# Patient Record
Sex: Female | Born: 1960
Health system: Southern US, Community
[De-identification: ages and names within clinical notes are randomized; demographics above are authoritative.]

## PROBLEM LIST (undated history)

## (undated) DIAGNOSIS — F419 Anxiety disorder, unspecified: Secondary | ICD-10-CM

## (undated) DIAGNOSIS — F32A Depression, unspecified: Secondary | ICD-10-CM

## (undated) DIAGNOSIS — I10 Essential (primary) hypertension: Secondary | ICD-10-CM

## (undated) DIAGNOSIS — S82899A Other fracture of unspecified lower leg, initial encounter for closed fracture: Secondary | ICD-10-CM

## (undated) DIAGNOSIS — F329 Major depressive disorder, single episode, unspecified: Secondary | ICD-10-CM

---

## 1998-02-18 ENCOUNTER — Encounter: Admission: RE | Admit: 1998-02-18 | Discharge: 1998-03-10 | Payer: Self-pay | Admitting: Orthopedic Surgery

## 1998-04-06 ENCOUNTER — Encounter: Admission: RE | Admit: 1998-04-06 | Discharge: 1998-04-23 | Payer: Self-pay | Admitting: Orthopedic Surgery

## 1999-03-01 HISTORY — PX: ABDOMINAL HYSTERECTOMY: SHX81

## 2001-04-20 ENCOUNTER — Encounter: Payer: Self-pay | Admitting: *Deleted

## 2001-04-20 ENCOUNTER — Ambulatory Visit (HOSPITAL_COMMUNITY): Admission: RE | Admit: 2001-04-20 | Discharge: 2001-04-20 | Payer: Self-pay | Admitting: *Deleted

## 2001-05-31 ENCOUNTER — Encounter: Payer: Self-pay | Admitting: Neurosurgery

## 2001-05-31 ENCOUNTER — Encounter: Admission: RE | Admit: 2001-05-31 | Discharge: 2001-05-31 | Payer: Self-pay | Admitting: Neurosurgery

## 2001-06-13 ENCOUNTER — Encounter: Admission: RE | Admit: 2001-06-13 | Discharge: 2001-06-13 | Payer: Self-pay | Admitting: Neurosurgery

## 2001-06-13 ENCOUNTER — Encounter: Payer: Self-pay | Admitting: Neurosurgery

## 2001-06-29 ENCOUNTER — Encounter: Payer: Self-pay | Admitting: Neurosurgery

## 2001-06-29 ENCOUNTER — Encounter: Admission: RE | Admit: 2001-06-29 | Discharge: 2001-06-29 | Payer: Self-pay | Admitting: Neurosurgery

## 2004-02-29 HISTORY — PX: OOPHORECTOMY: SHX86

## 2004-03-09 ENCOUNTER — Ambulatory Visit: Payer: Self-pay | Admitting: Internal Medicine

## 2004-03-12 ENCOUNTER — Ambulatory Visit: Payer: Self-pay | Admitting: Internal Medicine

## 2007-09-18 ENCOUNTER — Ambulatory Visit (HOSPITAL_COMMUNITY): Admission: RE | Admit: 2007-09-18 | Discharge: 2007-09-18 | Payer: Self-pay | Admitting: Family Medicine

## 2009-09-08 ENCOUNTER — Encounter: Payer: Self-pay | Admitting: Family Medicine

## 2009-09-14 ENCOUNTER — Ambulatory Visit: Payer: Self-pay | Admitting: Family Medicine

## 2009-09-14 DIAGNOSIS — M25569 Pain in unspecified knee: Secondary | ICD-10-CM | POA: Insufficient documentation

## 2009-09-16 ENCOUNTER — Ambulatory Visit (HOSPITAL_COMMUNITY): Admission: RE | Admit: 2009-09-16 | Discharge: 2009-09-16 | Payer: Self-pay | Admitting: Family Medicine

## 2009-09-18 ENCOUNTER — Encounter: Payer: Self-pay | Admitting: Family Medicine

## 2009-12-21 ENCOUNTER — Emergency Department (HOSPITAL_COMMUNITY): Admission: EM | Admit: 2009-12-21 | Discharge: 2009-12-21 | Payer: Self-pay | Admitting: Family Medicine

## 2010-01-18 ENCOUNTER — Emergency Department (HOSPITAL_COMMUNITY): Admission: EM | Admit: 2010-01-18 | Discharge: 2010-01-18 | Payer: Self-pay | Admitting: Emergency Medicine

## 2010-03-21 ENCOUNTER — Encounter: Payer: Self-pay | Admitting: Family Medicine

## 2010-03-30 NOTE — Miscellaneous (Signed)
Summary: REFERRAL  Clinical Lists Changes Discussion with her re her knee pain--the MRI did not show a meniscal tear which was my primary objective--however it did incidentally identify a small appareent ganglion cyst at the origin of the lateral head of the gastro. Seen best series 2 PDFS image 15/20. Kristen King relates her mother having sarcoma in her ankle --evidently also thought to be a ganglion cyst initially. She is quite concerned. We agree to send her for f/u of this abnormality to the surgeon who saw her mother. She is aware this is not part of the Worker's Comp claim and the referral will be billed under  her insurance. Orders: Added new Referral order of Orthopedic Surgeon Referral (Ortho Surgeon) - Signed

## 2010-03-30 NOTE — Consult Note (Signed)
Summary: Consultation Report  Consultation Report   Imported By: Marily Memos 09/08/2009 11:29:21  _____________________________________________________________________  External Attachment:    Type:   Image     Comment:   External Document

## 2010-03-30 NOTE — Assessment & Plan Note (Signed)
Summary: WC-CLAIM#[id number],L KNEE INJURY,MC   Vital Signs:  Patient profile:   50 year old female Height:      61 inches Weight:      183 pounds BMI:     34.70 BP sitting:   142 / 80  Vitals Entered By: Lillia Pauls CMA (September 14, 2009 3:26 PM)  History of Present Illness: DATE of INJURY:  05/11/2009  fell onto left knee while at work. fell onto floor surface Has had continued pain since. Was seen at occupational health 08/18/2009. Still having left knee pain--especially after long periods of standing. It also swells some at those times. Moving a certain way causes a sharper pain. Steps are bothersome. No giving way or locking. PERTINENT PMH/PSH: no prior knee injury or surgery on left  Current Medications (verified): 1)  None  Allergies (verified): 1)  ! Demerol 2)  ! Morphine  Physical Exam  General:  alert, well-developed, well-nourished, and well-hydrated.   Msk:  Left knee focally tender along lateral joint line. + [place]. Ligamentously intact. No effusion. Mild tenderness over lateral patella. No fluid in prepatellar bursa FROm in extension andflexion. Calf is soft. No skin bruising or changes  around left knee. Distalklly neirovascularly intact Additional Exam:  US exam reveals no fluid in suprapatellar pouch. Normal patellar and quad tendon. Lateral meniscus is abnormal in appearance ([place] images saved)   Impression & Recommendations:  Problem # 1:  KNEE PAIN, LEFT (ICD-719.46)  Orders: MRI without Contrast (MRI w/o Contrast) [place] LIMITED ([id number]) possibly this represents meniscal tear . We discussed options and she has decided to go for MRI will get MRI

## 2010-11-26 LAB — CBC
HCT: 35.7 — ABNORMAL LOW
Hemoglobin: 12.1
MCHC: 33.9
MCV: 83.1
Platelets: 232
RBC: 4.3
RDW: 12.6
WBC: 5.1

## 2010-11-26 LAB — COMPREHENSIVE METABOLIC PANEL
ALT: 19
AST: 23
Albumin: 3.9
Alkaline Phosphatase: 64
BUN: 12
CO2: 29
Calcium: 9.3
Chloride: 105
Creatinine, Ser: 0.65
GFR calc Af Amer: >60
GFR calc non Af Amer: >60
Glucose, Bld: 96
Potassium: 4
Sodium: 141
Total Bilirubin: 0.7
Total Protein: 6.8

## 2010-11-26 LAB — LIPID PANEL
Cholesterol: 261 — ABNORMAL HIGH
HDL: 53
LDL Cholesterol: 193 — ABNORMAL HIGH
Total CHOL/HDL Ratio: 4.9
Triglycerides: 77
VLDL: 15

## 2010-11-26 LAB — VITAMIN D 25 HYDROXY (VIT D DEFICIENCY, FRACTURES): Vit D, 25-Hydroxy: 34 (ref 30–89)

## 2011-02-10 ENCOUNTER — Other Ambulatory Visit (HOSPITAL_COMMUNITY): Payer: Self-pay | Admitting: Orthopedic Surgery

## 2011-02-10 ENCOUNTER — Ambulatory Visit (HOSPITAL_COMMUNITY)
Admission: RE | Admit: 2011-02-10 | Discharge: 2011-02-10 | Disposition: A | Discharge status: Home / Self Care | Payer: 59 | Source: Ambulatory Visit | Attending: Orthopedic Surgery | Admitting: Orthopedic Surgery

## 2011-02-10 DIAGNOSIS — W19XXXA Unspecified fall, initial encounter: Secondary | ICD-10-CM | POA: Insufficient documentation

## 2011-02-10 DIAGNOSIS — R52 Pain, unspecified: Secondary | ICD-10-CM

## 2011-02-10 DIAGNOSIS — M25579 Pain in unspecified ankle and joints of unspecified foot: Secondary | ICD-10-CM | POA: Insufficient documentation

## 2011-05-08 ENCOUNTER — Encounter (HOSPITAL_COMMUNITY): Payer: Self-pay

## 2011-05-08 ENCOUNTER — Emergency Department (HOSPITAL_COMMUNITY)
Admission: EM | Admit: 2011-05-08 | Discharge: 2011-05-08 | Disposition: A | Discharge status: Home / Self Care | Payer: 59 | Source: Home / Self Care | Attending: Family Medicine | Admitting: Family Medicine

## 2011-05-08 DIAGNOSIS — J069 Acute upper respiratory infection, unspecified: Secondary | ICD-10-CM

## 2011-05-08 MED ORDER — IPRATROPIUM BROMIDE 0.06 % NA SOLN: 2.0000 | Freq: Four times a day (QID) | NASAL | Status: AC

## 2011-05-08 MED ORDER — AZITHROMYCIN 250 MG PO TABS: ORAL_TABLET | ORAL | Status: AC

## 2011-05-08 NOTE — ED Provider Notes (Signed)
History     CSN: [id number]  Arrival date & time 05/08/11  4098   First MD Initiated Contact with Patient 05/08/11 1013      Chief Complaint  Patient presents with  . Sore Throat    (Consider location/radiation/quality/duration/timing/severity/associated sxs/prior treatment) Patient is a 51 y.o. female presenting with pharyngitis. The history is provided by the patient.  Sore Throat This is a new problem. The current episode started more than 1 week ago. The problem occurs constantly. The problem has been gradually worsening.    History reviewed. No pertinent past medical history.  Past Surgical History  Procedure Date  . Abdominal hysterectomy     History reviewed. No pertinent family history.  History  Substance Use Topics  . Smoking status: Never Smoker   . Smokeless tobacco: Not on file  . Alcohol Use: Yes    OB History    Grav Para Term Preterm Abortions TAB SAB Ect Mult Living                  Review of Systems  Constitutional: Negative for fever.  HENT: Positive for congestion, sore throat, rhinorrhea and postnasal drip.   Respiratory: Positive for cough.     Allergies  Meperidine hcl and Morphine  Home Medications   Current Outpatient Rx  Name Route Sig Dispense Refill  . SERTRALINE HCL 100 MG PO TABS Oral Take 100 mg by mouth daily.    . AZITHROMYCIN 250 MG PO TABS  Take as directed on pack 6 each 0  . IPRATROPIUM BROMIDE 0.06 % NA SOLN Nasal Place 2 sprays into the nose 4 (four) times daily. 15 mL 1    BP 152/91  Pulse 84  Temp(Src) 99.4 F (37.4 C) (Oral)  Resp 18  SpO2 99%  Physical Exam  Nursing note and vitals reviewed. Constitutional: She is oriented to person, place, and time. She appears well-developed and well-nourished.  HENT:  Head: Normocephalic.  Right Ear: External ear normal.  Left Ear: External ear normal.  Nose: Nose normal.  Mouth/Throat: Oropharynx is clear and moist.  Eyes: Pupils are equal, round, and reactive  to light.  Neck: Normal range of motion. Neck supple.  Cardiovascular: Normal rate and normal heart sounds.   Pulmonary/Chest: Breath sounds normal.  Lymphadenopathy:    She has no cervical adenopathy.  Neurological: She is alert and oriented to person, place, and time.  Skin: Skin is warm and dry.    ED Course  Procedures (including critical care time)  Labs Reviewed - No data to display No results found.   1. URI (upper respiratory infection)       MDM         Linna Hoff, MD 05/08/11 1042

## 2011-05-08 NOTE — Discharge Instructions (Signed)
Drink plenty of fluids as discussed, use medicine as prescribed, and mucinex or delsym for cough. Return or see your doctor if further problems °

## 2011-05-08 NOTE — ED Notes (Signed)
Pt has sorethroat, left earache and head congestion for two weeks.

## 2012-04-16 ENCOUNTER — Ambulatory Visit (INDEPENDENT_AMBULATORY_CARE_PROVIDER_SITE_OTHER)
Admission: RE | Admit: 2012-04-16 | Discharge: 2012-04-16 | Disposition: A | Discharge status: Home / Self Care | Payer: 59 | Source: Ambulatory Visit | Attending: Emergency Medicine | Admitting: Emergency Medicine

## 2012-04-16 ENCOUNTER — Encounter (HOSPITAL_COMMUNITY): Payer: Self-pay | Admitting: *Deleted

## 2012-04-16 ENCOUNTER — Emergency Department (HOSPITAL_COMMUNITY)
Admission: EM | Admit: 2012-04-16 | Discharge: 2012-04-16 | Disposition: A | Discharge status: Home / Self Care | Payer: 59 | Source: Home / Self Care

## 2012-04-16 DIAGNOSIS — M65839 Other synovitis and tenosynovitis, unspecified forearm: Secondary | ICD-10-CM

## 2012-04-16 DIAGNOSIS — M659 Synovitis and tenosynovitis, unspecified: Secondary | ICD-10-CM

## 2012-04-16 DIAGNOSIS — M65849 Other synovitis and tenosynovitis, unspecified hand: Secondary | ICD-10-CM

## 2012-04-16 LAB — CBC WITH DIFFERENTIAL/PLATELET
Eosinophils Absolute: 0.2 10*3/uL (ref 0.0–0.7)
Eosinophils Relative: 3 % (ref 0–5)
HCT: 39 % (ref 36.0–46.0)
Hemoglobin: 13 g/dL (ref 12.0–15.0)
Lymphs Abs: 2.4 10*3/uL (ref 0.7–4.0)
MCH: 27.4 pg (ref 26.0–34.0)
MCV: 82.3 fL (ref 78.0–100.0)
Monocytes Relative: 6 % (ref 3–12)
RBC: 4.74 MIL/uL (ref 3.87–5.11)

## 2012-04-16 MED ORDER — NAPROXEN 500 MG PO TBEC: 500.0000 mg | DELAYED_RELEASE_TABLET | Freq: Two times a day (BID) | ORAL | Status: DC

## 2012-04-16 MED ORDER — HYDROCODONE-ACETAMINOPHEN 5-325 MG PO TABS
1.0000 | ORAL_TABLET | Freq: Once | ORAL | Status: AC
  Administered 2012-04-16: 1 via ORAL

## 2012-04-16 MED ORDER — CIPROFLOXACIN HCL 500 MG PO TABS: 500.0000 mg | ORAL_TABLET | Freq: Two times a day (BID) | ORAL | Status: DC

## 2012-04-16 MED ORDER — CEFTRIAXONE SODIUM 1 G IJ SOLR
1.0000 g | Freq: Once | INTRAMUSCULAR | Status: AC
  Administered 2012-04-16: 1 g via INTRAMUSCULAR

## 2012-04-16 MED ORDER — HYDROCODONE-ACETAMINOPHEN 5-325 MG PO TABS: ORAL_TABLET | ORAL | Status: DC

## 2012-04-16 MED ORDER — CEFTRIAXONE SODIUM 1 G IJ SOLR
INTRAMUSCULAR | Status: AC
  Filled 2012-04-16: qty 10

## 2012-04-16 NOTE — ED Provider Notes (Signed)
History     CSN: [id number]  Arrival date & time 04/16/12  1002   First MD Initiated Contact with Patient 04/16/12 1025      Chief Complaint  Patient presents with  . Hand Pain    (Consider location/radiation/quality/duration/timing/severity/associated sxs/prior treatment) HPI Comments: 52 year old female works at [employer] 5 days ago she felt an unusual tingling described as what it feels like it happened a busted blood vessel. This is located along the medial side of the right thumb. It had improved somewhat over the ensuing 2-3 days however yesterday evening there was a rapid swelling involving the distal aspect of the thumb and increased size of erythema in the distal phalanx and over the IP joint. She says it throbs and is uncomfortable. She also describes some limitation in range of motion including opposition. There is mild swelling of the thenar eminence. She denies constitutional symptoms.   History reviewed. No pertinent past medical history.  Past Surgical History  Procedure Laterality Date  . Abdominal hysterectomy      History reviewed. No pertinent family history.  History  Substance Use Topics  . Smoking status: Never Smoker   . Smokeless tobacco: Not on file  . Alcohol Use: Yes    OB History   Grav Para Term Preterm Abortions TAB SAB Ect Mult Living                  Review of Systems  HENT: Negative.   Cardiovascular: Negative.   Genitourinary: Negative.   Musculoskeletal:       See history of present illness  Skin: Positive for color change. Negative for rash and wound.  Neurological:       Some numbness in the involved comments above.  All other systems reviewed and are negative.    Allergies  Meperidine hcl and Morphine  Home Medications   Current Outpatient Rx  Name  Route  Sig  Dispense  Refill  . ciprofloxacin (CIPRO) 500 MG tablet   Oral   Take 1 tablet (500 mg total) by mouth 2 (two) times daily.   14 tablet    0   . ipratropium (ATROVENT) 0.06 % nasal spray   Nasal   Place 2 sprays into the nose 4 (four) times daily.   15 mL   1   . naproxen (EC-NAPROSYN) 500 MG EC tablet   Oral   Take 1 tablet (500 mg total) by mouth 2 (two) times daily with a meal.   20 tablet   0   . sertraline (ZOLOFT) 100 MG tablet   Oral   Take 100 mg by mouth daily.           BP 148/88  Pulse 72  Temp(Src) 98.6 F (37 C) (Oral)  Resp 18  SpO2 100%  Physical Exam  Nursing note and vitals reviewed. Constitutional: She is oriented to person, place, and time. She appears well-developed and well-nourished. No distress.  Neck: Normal range of motion. Neck supple.  Cardiovascular: Normal rate.   Pulmonary/Chest: Effort normal.  Musculoskeletal:  Tenderness over the right thumb distal phalanx and IP joint. Area of swelling and erythema along the medial edge of the thumb. There is decreased range of motion with flexion of the IP joint and although she can oppose her calm there is some difficulty in that there is mild swelling of the thenar eminence. No erythema or lymphangitis extends into the hand. There is no tenderness or erythema of the MCP. Distal  sensation is somewhat disturbed and that she can feel pressure but there is also numbness present. There is mild increased warmth in the thumb.  Neurological: She is alert and oriented to person, place, and time. She exhibits normal muscle tone.  Skin: No rash noted.  C. musculoskeletal above.  Psychiatric: She has a normal mood and affect.    ED Course  Procedures (including critical care time)  Labs Reviewed - No data to display No results found.   1. Tenosynovitis of finger       MDM  The differential includes cellulitis as well as a tenosynovitis. No history or signs of injury/trauma. Although they were milder symptoms 5 days ago last night there was rapid progression of erythema, swelling and pain of the right thumb. With decreased motion. I  Obtained an appointment for her at Dr. Ronie Spies office tomorrow morning at 9:20. She has to be there  30 minutes early. She also knows to call this morning to give additional demographic data to the office. She will be administered Rocephin 1 g IM prior to discharge And Cipro 500 mg twice a day, and Naprosyn EC 500 mg twice a day with food. A splint in position of function will be applied. 1230: Dr. Mina Marble had his office call me back to order WBC and uric acid, xray R thumb. Pt st since she has talked to his office and the appointment is 3 d from now will return to the [facility] in the AM for labs and x ray. She lives out of town.  The patient was called back to obtain labs: Uric acid, a CBC an x-ray of the finger. She is also given Lortab 5/325 one by mouth now and a prescription for the same with Su 1-2 every 4 hours when necessary pain #15.  1615: Results of the other lab work now available. All within normal limits. Dr. Mina Marble was called his cell phone and his answering service picked up a message was left. Approximately 20 minutes later call the office and they were able to get a hold of him. The lab work results were also given to him the final decision was to have the patient followup in his office tomorrow morning with an appointment at 9:00 and instructions to be there at 8:30. The patient was here and was made aware of these instructions and given a written appointment time and date.     Hayden Rasmussen, NP 04/16/12 1106  Hayden Rasmussen, NP 04/16/12 1438  Hayden Rasmussen, NP 04/16/12 [phone]

## 2012-04-16 NOTE — ED Provider Notes (Signed)
Medical screening examination/treatment/procedure(s) were performed by non-physician practitioner and as supervising physician I was immediately available for consultation/collaboration.  Massiah Minjares, M.D.  Thomas Mabry C Abdulahi Schor, MD 04/16/12 2132 

## 2012-04-16 NOTE — ED Notes (Signed)
Pt  Reports  painfull   Swollen  r thumb  X  5  Days    -  She  denys  Any  Injury

## 2013-07-16 ENCOUNTER — Emergency Department (HOSPITAL_COMMUNITY): Payer: PRIVATE HEALTH INSURANCE

## 2013-07-16 ENCOUNTER — Emergency Department (HOSPITAL_COMMUNITY)
Admission: EM | Admit: 2013-07-16 | Discharge: 2013-07-16 | Disposition: A | Discharge status: Home / Self Care | Payer: PRIVATE HEALTH INSURANCE | Attending: Emergency Medicine | Admitting: Emergency Medicine

## 2013-07-16 ENCOUNTER — Encounter (HOSPITAL_COMMUNITY): Payer: Self-pay | Admitting: Emergency Medicine

## 2013-07-16 DIAGNOSIS — Z79899 Other long term (current) drug therapy: Secondary | ICD-10-CM | POA: Insufficient documentation

## 2013-07-16 DIAGNOSIS — Y929 Unspecified place or not applicable: Secondary | ICD-10-CM | POA: Insufficient documentation

## 2013-07-16 DIAGNOSIS — S93401A Sprain of unspecified ligament of right ankle, initial encounter: Secondary | ICD-10-CM

## 2013-07-16 DIAGNOSIS — Z791 Long term (current) use of non-steroidal anti-inflammatories (NSAID): Secondary | ICD-10-CM | POA: Insufficient documentation

## 2013-07-16 DIAGNOSIS — X500XXA Overexertion from strenuous movement or load, initial encounter: Secondary | ICD-10-CM | POA: Insufficient documentation

## 2013-07-16 DIAGNOSIS — Y9301 Activity, walking, marching and hiking: Secondary | ICD-10-CM | POA: Insufficient documentation

## 2013-07-16 DIAGNOSIS — Z792 Long term (current) use of antibiotics: Secondary | ICD-10-CM | POA: Insufficient documentation

## 2013-07-16 DIAGNOSIS — S93409A Sprain of unspecified ligament of unspecified ankle, initial encounter: Secondary | ICD-10-CM | POA: Insufficient documentation

## 2013-07-16 DIAGNOSIS — F3289 Other specified depressive episodes: Secondary | ICD-10-CM | POA: Insufficient documentation

## 2013-07-16 DIAGNOSIS — F329 Major depressive disorder, single episode, unspecified: Secondary | ICD-10-CM | POA: Insufficient documentation

## 2013-07-16 DIAGNOSIS — Z87891 Personal history of nicotine dependence: Secondary | ICD-10-CM | POA: Insufficient documentation

## 2013-07-16 DIAGNOSIS — F411 Generalized anxiety disorder: Secondary | ICD-10-CM | POA: Insufficient documentation

## 2013-07-16 HISTORY — DX: Major depressive disorder, single episode, unspecified: F32.9

## 2013-07-16 HISTORY — DX: Anxiety disorder, unspecified: F41.9

## 2013-07-16 HISTORY — DX: Depression, unspecified: F32.A

## 2013-07-16 NOTE — Discharge Instructions (Signed)
Take Ibuprofen or tylenol as needed for pain. Bear weight on your ankle as tolerated. Refer to attached documents for more information.

## 2013-07-16 NOTE — ED Notes (Signed)
Pt returned from xray

## 2013-07-16 NOTE — ED Notes (Signed)
Onset 7:20am pt turned right foot while walking.  Has been working all morning.  At 12:30p pt sat down for lunch, pt took, shoe off and swelling increased, is now unable to put shoe back on.  Pain while sitting 3/10, now when walking 10/10.  + pedal pulse.  Bottom pad of foot feels like pins and needles.  Pain is to lateral ankle and back of foot.

## 2013-07-16 NOTE — ED Provider Notes (Signed)
CSN: [id number]     Arrival date & time 07/16/13  1333 History  This chart was scribed for non-physician practitioner, Alvina Chou, PA-C working with Virgel Manifold, MD by Frederich Balding, ED scribe. This patient was seen in room TR08C/TR08C and the patient's care was started at 2:30 PM.   Chief Complaint  Patient presents with  . Ankle Pain   The history is provided by the patient. No language interpreter was used.   HPI Comments: Kristen King is a 53 y.o. female who presents to the Emergency Department complaining of right ankle injury that occurred earlier this morning. Pt was walking, stepped wrong and rolled her ankle. She has sudden onset right ankle pain with associated swelling that has worsened throughout the day. Pt has elevated with little relief. Bearing weight worsens the pain.   Past Medical History  Diagnosis Date  . Anxiety   . Depression    Past Surgical History  Procedure Laterality Date  . Abdominal hysterectomy     No family history on file. History  Substance Use Topics  . Smoking status: Former Research scientist (life sciences)  . Smokeless tobacco: Not on file  . Alcohol Use: 2.2 oz/week    2 Cans of beer, 2 Drinks containing 0.5 oz of alcohol per week     Comment: several beers or drinks on the weekends    OB History   Grav Para Term Preterm Abortions TAB SAB Ect Mult Living                 Review of Systems  Musculoskeletal: Positive for arthralgias and joint swelling.  All other systems reviewed and are negative.  Allergies  Meperidine hcl and Morphine  Home Medications   Prior to Admission medications   Medication Sig Start Date End Date Taking? Authorizing Provider  ciprofloxacin (CIPRO) 500 MG tablet Take 1 tablet (500 mg total) by mouth 2 (two) times daily. 04/16/12   Janne Napoleon, NP  HYDROcodone-acetaminophen (NORCO/VICODIN) 5-325 MG per tablet Take 1 or 2 tablets q 4 hours prn pain 04/16/12   Janne Napoleon, NP  naproxen (EC-NAPROSYN) 500 MG EC tablet Take 1 tablet  (500 mg total) by mouth 2 (two) times daily with a meal. 04/16/12   Janne Napoleon, NP  sertraline (ZOLOFT) 100 MG tablet Take 100 mg by mouth daily.    Historical Provider, MD   BP 171/72  Pulse 78  Temp(Src) 98.7 F (37.1 C) (Oral)  Resp 18  Ht 5' (1.524 m)  Wt 190 lb (86.183 kg)  BMI 37.11 kg/m2  SpO2 98%  Physical Exam  Nursing note and vitals reviewed. Constitutional: She is oriented to person, place, and time. She appears well-developed and well-nourished. No distress.  HENT:  Head: Normocephalic and atraumatic.  Eyes: EOM are normal.  Neck: Neck supple. No tracheal deviation present.  Cardiovascular: Normal rate.   Pulmonary/Chest: Effort normal. No respiratory distress.  Musculoskeletal: Normal range of motion.  Lateral malleolar edema of left ankle with tenderness to palpation. Limited ROM due to pain. No obvious deformity. Pt is able to wiggle toes.   Neurological: She is alert and oriented to person, place, and time.  Skin: Skin is warm and dry.  Psychiatric: She has a normal mood and affect. Her behavior is normal.    ED Course  Procedures (including critical care time)  SPLINT APPLICATION Date/Time: 3:22 PM Authorized by: Alvina Chou Consent: Verbal consent obtained. Risks and benefits: risks, benefits and alternatives were discussed Consent given by: patient Splint applied  by: nurse Location details: right ankle Splint type: ASO splint Supplies used: ASO splint Post-procedure: The splinted body part was neurovascularly unchanged following the procedure. Patient tolerance: Patient tolerated the procedure well with no immediate complications.     DIAGNOSTIC STUDIES: Oxygen Saturation is 98% on RA, normal by my interpretation.    COORDINATION OF CARE: 2:31 PM-Discussed treatment plan which includes an ankle brace, crutches and ibuprofen with pt at bedside and pt agreed to plan.   Labs Review Labs Reviewed - No data to display  Imaging Review Dg  Ankle Complete Right  07/16/2013   CLINICAL DATA:  53 year old female status post right ankle injury with pain posteriorly. Initial encounter.  EXAM: RIGHT ANKLE - COMPLETE 3+ VIEW  COMPARISON:  None.  FINDINGS: Bone mineralization is within normal limits. No joint effusion identified. Calcaneus appears intact with posterior degenerative spurring. Mortise joint alignment and tailored dome preserved. No acute fracture identified.  IMPRESSION: No acute fracture or dislocation identified about the right ankle.   Electronically Signed   By: Lars Pinks M.D.   On: 07/16/2013 14:23     EKG Interpretation None      MDM   Final diagnoses:  Right ankle sprain    2:43 PM Patient's xray shows no acute fracture. Patient likely sprained her ankle. Patient will have ASO and instructions to rest, ice, and elevate. Patient denies any other injury.   I personally performed the services described in this documentation, which was scribed in my presence. The recorded information has been reviewed and is accurate.  Alvina Chou, PA-C 07/16/13 1610

## 2013-07-20 NOTE — ED Provider Notes (Signed)
Medical screening examination/treatment/procedure(s) were performed by non-physician practitioner and as supervising physician I was immediately available for consultation/collaboration.   EKG Interpretation None       Virgel Manifold, MD 07/20/13 1341

## 2013-10-17 ENCOUNTER — Ambulatory Visit (HOSPITAL_COMMUNITY): Payer: Self-pay | Admitting: Physician Assistant

## 2013-10-25 ENCOUNTER — Encounter (INDEPENDENT_AMBULATORY_CARE_PROVIDER_SITE_OTHER): Payer: Self-pay

## 2013-10-25 ENCOUNTER — Ambulatory Visit (INDEPENDENT_AMBULATORY_CARE_PROVIDER_SITE_OTHER): Payer: 59 | Admitting: Physician Assistant

## 2013-10-25 ENCOUNTER — Encounter (HOSPITAL_COMMUNITY): Payer: Self-pay | Admitting: Physician Assistant

## 2013-10-25 VITALS — BP 130/87 | HR 63 | Ht 60.0 in | Wt 189.0 lb

## 2013-10-25 DIAGNOSIS — F3289 Other specified depressive episodes: Secondary | ICD-10-CM

## 2013-10-25 DIAGNOSIS — F321 Major depressive disorder, single episode, moderate: Secondary | ICD-10-CM

## 2013-10-25 DIAGNOSIS — F329 Major depressive disorder, single episode, unspecified: Secondary | ICD-10-CM

## 2013-10-25 MED ORDER — VENLAFAXINE HCL ER 37.5 MG PO CP24: 37.5000 mg | ORAL_CAPSULE | Freq: Every day | ORAL | Status: DC

## 2013-10-25 NOTE — Progress Notes (Signed)
Psychiatric Assessment Adult  Patient Identification:  Kristen King Date of Evaluation:  10/25/2013 Chief Complaint: depression History of Chief Complaint:   Chief Complaint  Patient presents with  . 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 This started about a year ago with difficult situation at work involving her Engineer, maintenance (IT).   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 She reports a loss of trust for her Surveyor, quantity, feels that she has been compromised morally, and that she is being targeted at work.  She has followed the proper protocol for conflict resolution per her employer's policies but has not had any follow up. She states no changes have been made in departmental procedures, and that her AD has not been held accountable for her decisions or responsibilities. The patient strongly desires closure to this incident.  Review of Systems  Psychiatric/Behavioral: Positive for dysphoric mood and decreased concentration. The patient is nervous/anxious.   All other systems reviewed and are negative.  Physical Exam  Constitutional: She appears well-developed and well-nourished.  Psychiatric: Her speech is normal and behavior is normal. Judgment and thought content normal. Her mood appears anxious. Cognition and memory are normal. She exhibits a depressed mood.    Depressive Symptoms: depressed mood, anhedonia, psychomotor  retardation, fatigue, difficulty concentrating, hopelessness, anxiety,  (Hypo) Manic Symptoms:   Elevated Mood:  No Irritable Mood:  Yes Grandiosity:  No Distractibility:  No Labiality of Mood:  No Delusions:  No Hallucinations:  No Impulsivity:  No Sexually Inappropriate Behavior:  No Financial Extravagance:  No Flight of Ideas:  No  Anxiety Symptoms: Excessive Worry:  Yes Panic Symptoms:  Yes Agoraphobia:  No Obsessive Compulsive: No  Symptoms: None, Specific Phobias:  No Social Anxiety:  Yes  Psychotic Symptoms:  Hallucinations: No None Delusions:  No Paranoia:  No   Ideas of Reference:  No  PTSD Symptoms: Ever had a traumatic exposure:  No Had a traumatic exposure in the last month:  No Re-experiencing: No None Hypervigilance:  No Hyperarousal: No None Avoidance: No None  Traumatic Brain Injury: No   Past Psychiatric History: Diagnosis:   Hospitalizations:   Outpatient Care:   Substance Abuse Care:   Self-Mutilation:   Suicidal Attempts:   Violent Behaviors:    Past Medical History:   Past Medical History  Diagnosis Date  . Anxiety   . Depression    History of Loss of Consciousness:  No Seizure History:  No Cardiac History:  No Allergies:   Allergies  Allergen Reactions  . Meperidine Hcl   . Morphine   . Novocain [Procaine] Swelling   Current Medications:  Current Outpatient Prescriptions  Medication Sig Dispense Refill  . sertraline (ZOLOFT) 100 MG tablet Take 100 mg by mouth daily.       No current facility-administered medications for this visit.    Previous Psychotropic Medications:  Medication Dose  Substance Abuse History in the last 12 months: Substance Age of 1st Use Last Use Amount Specific Type  Nicotine          Alcohol          Cannabis          Opiates          Cocaine          Methamphetamines          LSD          Ecstasy           Benzodiazepines          Caffeine           Inhalants          Others:                          Medical Consequences of Substance Abuse:   Legal Consequences of Substance Abuse:   Family Consequences of Substance Abuse:   Blackouts:  No DT's:  No Withdrawal Symptoms:  No None  Social History: Current Place of Residence: [place] of Birth: [place] Family Members: Mother, 3 sisters, father deceased Marital Status:  Married Children: 2  Sons: 1  Daughters: 1 Relationships: 1 Education:  Dentist Problems/Performance:  Religious Beliefs/Practices: agnostic History of Abuse: none Ship broker History:  None. Legal History:  Hobbies/Interests:   Family History:  No family history on file.  Mental Status Examination/Evaluation: Objective:  Appearance: Fairly Groomed  Eye Contact::  Good  Speech:  Normal Rate  Volume:  Normal  Mood:  depressed  Affect:  Tearful  Thought Process:  Goal Directed  Orientation:  Full (Time, Place, and Person)  Thought Content:  Negative  Suicidal Thoughts:  No  Homicidal Thoughts:  No  Judgement:  Good  Insight:  Good  Psychomotor Activity:  Normal  Akathisia:  No  Handed:  Right  AIMS (if indicated):    Assets:  Communication Skills Desire for Improvement Housing Leisure Time [place] Talents/Skills Transportation Vocational/Educational    Laboratory/X-Ray Psychological Evaluation(s)  requested    Assessment:    AXIS I Depression  AXIS II Deferred  AXIS III Past Medical History  Diagnosis Date  . Anxiety   . Depression      AXIS IV occupational problems  AXIS V 51-60 moderate symptoms   Treatment Plan/Recommendations:  Plan of Care: 1. Consult with OB regarding use of estrogen and possible d/c.                         2. Consider OPT for support.                         3. Effexor XR start 37.5 x 1 week, then 75 mg po qd.                         4. Send copies of most recent  labs.                         5. Start to decrease Zoloft with cross taper.  Laboratory:  none at this time.  Psychotherapy: as planned  Medications:  Vitamin D3 daily, plus B12 to start today  Routine PRN Medications:  No  Consultations: as needed  Safety Concerns:  None  at this time.  Other:      Nazier Neyhart, PA-C 8/28/201510:57 AM

## 2013-10-25 NOTE — Patient Instructions (Signed)
1. Call OB regarding decrease or discontinuing Estrogen due to increased emotional response. Consider Supplemental options for menopausal side effects. 2. Start medication as ordered. 3. Start Vit. D3 and B12 each day. 4. Decrease Zoloft as discussed. 5. RTO 2-3 weeks.

## 2013-11-01 ENCOUNTER — Encounter: Payer: Self-pay | Admitting: Physician Assistant

## 2013-11-08 ENCOUNTER — Ambulatory Visit (HOSPITAL_COMMUNITY): Payer: Self-pay | Admitting: Physician Assistant

## 2013-11-12 ENCOUNTER — Encounter (HOSPITAL_COMMUNITY): Payer: Self-pay | Admitting: Physician Assistant

## 2013-11-12 ENCOUNTER — Ambulatory Visit (HOSPITAL_COMMUNITY): Payer: Self-pay | Admitting: Physician Assistant

## 2013-11-12 ENCOUNTER — Ambulatory Visit (INDEPENDENT_AMBULATORY_CARE_PROVIDER_SITE_OTHER): Payer: 59 | Admitting: Physician Assistant

## 2013-11-12 VITALS — BP 131/73 | HR 70 | Ht 60.0 in | Wt 186.0 lb

## 2013-11-12 DIAGNOSIS — T887XXD Unspecified adverse effect of drug or medicament, subsequent encounter: Secondary | ICD-10-CM

## 2013-11-12 DIAGNOSIS — T50905D Adverse effect of unspecified drugs, medicaments and biological substances, subsequent encounter: Secondary | ICD-10-CM

## 2013-11-12 DIAGNOSIS — Z5689 Other problems related to employment: Secondary | ICD-10-CM

## 2013-11-12 DIAGNOSIS — I10 Essential (primary) hypertension: Secondary | ICD-10-CM

## 2013-11-12 DIAGNOSIS — Z569 Unspecified problems related to employment: Secondary | ICD-10-CM

## 2013-11-12 DIAGNOSIS — F321 Major depressive disorder, single episode, moderate: Secondary | ICD-10-CM

## 2013-11-12 DIAGNOSIS — F331 Major depressive disorder, recurrent, moderate: Secondary | ICD-10-CM

## 2013-11-12 MED ORDER — VENLAFAXINE HCL ER 75 MG PO TB24: 75.0000 mg | ORAL_TABLET | ORAL | Status: DC

## 2013-11-12 NOTE — Progress Notes (Signed)
[facility] [phone] Progress Note  Kristen King [id number] 53 y.o.  11/12/2013 8:42 AM  Chief Complaint: Depression  History of Present Illness:  Patient notes that she is feeling much better since her last visit. She has discontinued her Estrogen and feels at least 50% better. She states that she is now less emotionally labile and is able to "let go of some of the things that have been bothering her. Her husband has even commented on how much better she seems. Work is still difficult as she is constantly being asked by her co-workers to help them go "over the assistant Director's head, to her supervisor" but she is resistant and does not want to get involved fearing retribution. She continues to feel as if her every move is being watched, and at times feels that her superiors are looking for an any excuse to let her go.    She does report some return of the hot flashes but is not otherwise significantly bothered by the cessation of the estrogen. She reports no side effects from the Effexor XR and feels that it has helped her. Her sleep is good, and her appetite is also good.      Suicidal Ideation: No Plan Formed: No Patient has means to carry out plan: No  Homicidal Ideation: No Plan Formed: No Patient has means to carry out plan: No  Review of Systems: Psychiatric: Agitation: No Hallucination: No Depressed Mood: Yes Insomnia: No Hypersomnia: No Altered Concentration: No Feels Worthless: No Grandiose Ideas: No Belief In Special Powers: No New/Increased Substance Abuse: No Compulsions: No  Neurologic: Headache: No Seizure: No Paresthesias: No  Past Medical Family, Social History: No changes  Outpatient Encounter Prescriptions as of 11/12/2013  Medication Sig  . venlafaxine XR (EFFEXOR XR) 37.5 MG 24 hr capsule Take 1 capsule (37.5 mg total) by mouth daily.    Past Psychiatric History/Hospitalization(s): Anxiety:  Bipolar Disorder:  Depression:  Mania:   Psychosis:  Schizophrenia:  Personality Disorder:  Hospitalization for psychiatric illness:  History of Electroconvulsive Shock Therapy:  Prior Suicide Attempts: NA  Physical Exam: Constitutional:  BP 131/73  Pulse 70  Ht 5' (1.524 m)  Wt 186 lb (84.369 kg)  BMI 36.33 kg/m2  General Appearance: alert, oriented, no acute distress  Musculoskeletal: Strength & Muscle Tone: within normal limits Gait & Station: normal Patient leans: N/A  Psychiatric: Speech (describe rate, volume, coherence, spontaneity, and abnormalities if any):   Thought Process (describe rate, content, abstract reasoning, and computation):   Associations: Coherent and Relevant  Thoughts: normal  Mental Status: Orientation: oriented to person, place and time/date Mood & Affect: depressed affect and anxiety Attention Span & Concentration: normal  Medical Decision Making (Choose Three):   Assessment: Axis I: Major DD single episode moderate improving             Occupational problems             Adverse reaction to medication sequela, subsequent encounter.    Axis II:   Axis III:   Axis IV:   Axis V:     Plan:  1. Continue all medication as written. 2. Continue regular rest, exercise and diet as suggested. 3. 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 4. Follow up as planned. 5. Call this office for questions or problems.  Manhattan Mccuen, PA-C 11/12/2013

## 2013-11-15 DIAGNOSIS — F331 Major depressive disorder, recurrent, moderate: Secondary | ICD-10-CM | POA: Insufficient documentation

## 2013-11-15 DIAGNOSIS — T887XXA Unspecified adverse effect of drug or medicament, initial encounter: Secondary | ICD-10-CM | POA: Insufficient documentation

## 2013-11-15 DIAGNOSIS — Z569 Unspecified problems related to employment: Secondary | ICD-10-CM | POA: Insufficient documentation

## 2013-12-24 ENCOUNTER — Encounter (HOSPITAL_COMMUNITY): Payer: Self-pay | Admitting: Physician Assistant

## 2013-12-24 ENCOUNTER — Ambulatory Visit (INDEPENDENT_AMBULATORY_CARE_PROVIDER_SITE_OTHER): Payer: 59 | Admitting: Physician Assistant

## 2013-12-24 VITALS — BP 157/94 | HR 78 | Ht 60.0 in | Wt 182.0 lb

## 2013-12-24 DIAGNOSIS — F321 Major depressive disorder, single episode, moderate: Secondary | ICD-10-CM

## 2013-12-24 DIAGNOSIS — Z569 Unspecified problems related to employment: Secondary | ICD-10-CM

## 2013-12-24 MED ORDER — VENLAFAXINE HCL ER 75 MG PO TB24: 75.0000 mg | ORAL_TABLET | ORAL | Status: DC

## 2013-12-24 NOTE — Patient Instructions (Signed)
1. Continue all medication as ordered. 2. Call this office if you have any questions or concerns. 3. Continue to get regular exercise 3-5 times a week. 4. Continue to eat a healthy nutritionally balanced diet. 5. Continue to reduce stress and anxiety through activities such as yoga, mindfulness, meditation and or prayer. 6. Keep all appointments with your out patient therapist and have notes forwarded to this office. (If you do not have one and would like to be scheduled with a therapist, please let our office assist you with this. 7. Follow up as planned 3 months. 

## 2013-12-24 NOTE — Progress Notes (Signed)
  [facility] [phone] Progress Note  Kristen King [id number] 53 y.o.  12/24/2013 11:42 AM  Chief Complaint: Depression  History of Present Illness:  Patient notes that she is much better. Things at work are the same but she is coping well. No new complaints. Meds are working well. She has lost 17 pounds, by cutting out carbs.  States she feels much better and is more active.  Suicidal Ideation: No Plan Formed: No Patient has means to carry out plan: No  Homicidal Ideation: No Plan Formed: No Patient has means to carry out plan: No  Review of Systems: Psychiatric: Agitation: No Hallucination: No Depressed Mood: Yes Insomnia: No Hypersomnia: No Altered Concentration: No Feels Worthless: No Grandiose Ideas: No Belief In Special Powers: No New/Increased Substance Abuse: No Compulsions: No  Neurologic: Headache: No Seizure: No Paresthesias: No  Past Medical Family, Social History: States that she can no longer tolerate alcohol which is fine with her.  Outpatient Encounter Prescriptions as of 12/24/2013  Medication Sig  . Venlafaxine HCl 75 MG TB24 Take 1 tablet (75 mg total) by mouth every morning.    Past Psychiatric History/Hospitalization(s): Anxiety:  3/10 Bipolar Disorder: no Depression:  2/10 Mania: no Psychosis: no  Schizophrenia:  Personality Disorder:  Hospitalization for psychiatric illness:  History of Electroconvulsive Shock Therapy:  Prior Suicide Attempts: NA  Physical Exam: Constitutional:  BP 157/94  Pulse 78  Ht 5' (1.524 m)  Wt 182 lb (82.555 kg)  BMI 35.54 kg/m2  General Appearance: alert, oriented, no acute distress  Musculoskeletal: Strength & Muscle Tone: within normal limits Gait & Station: normal Patient leans: N/A  Psychiatric: Speech (describe rate, volume, coherence, spontaneity, and abnormalities if any):   Thought Process (describe rate, content, abstract reasoning, and computation):   Associations: Coherent  and Relevant  Thoughts: normal  Mental Status: Orientation: oriented to person, place and time/date Mood & Affect: depressed affect and anxiety Attention Span & Concentration: normal  Medical Decision Making (Choose Three):   Assessment: Axis I: Major DD single episode moderate stable             Occupational problems             Adverse reaction to medication sequela, subsequent encounter.     Plan:  1. Continue all medication as written x 3 months. 2. Continue regular rest, exercise and diet as suggested. 3. 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 4. Follow up as planned. 5. Call this office for questions or problems.  Heman Que, PA-C 12/24/2013

## 2014-01-13 ENCOUNTER — Other Ambulatory Visit (HOSPITAL_COMMUNITY): Payer: Self-pay | Admitting: Physician Assistant

## 2014-03-27 ENCOUNTER — Ambulatory Visit (HOSPITAL_COMMUNITY): Payer: Self-pay | Admitting: Physician Assistant

## 2014-04-04 ENCOUNTER — Ambulatory Visit (HOSPITAL_COMMUNITY): Payer: Self-pay | Admitting: Physician Assistant

## 2014-04-24 ENCOUNTER — Ambulatory Visit (HOSPITAL_COMMUNITY): Payer: Self-pay | Admitting: Physician Assistant

## 2014-04-30 ENCOUNTER — Encounter (HOSPITAL_COMMUNITY): Payer: Self-pay | Admitting: Physician Assistant

## 2014-04-30 ENCOUNTER — Ambulatory Visit (INDEPENDENT_AMBULATORY_CARE_PROVIDER_SITE_OTHER): Payer: 59 | Admitting: Physician Assistant

## 2014-04-30 VITALS — BP 150/90 | HR 80 | Ht 60.0 in | Wt 183.0 lb

## 2014-04-30 DIAGNOSIS — F5105 Insomnia due to other mental disorder: Secondary | ICD-10-CM

## 2014-04-30 DIAGNOSIS — F4321 Adjustment disorder with depressed mood: Secondary | ICD-10-CM

## 2014-04-30 DIAGNOSIS — Z569 Unspecified problems related to employment: Secondary | ICD-10-CM

## 2014-04-30 DIAGNOSIS — G4700 Insomnia, unspecified: Secondary | ICD-10-CM

## 2014-04-30 DIAGNOSIS — F321 Major depressive disorder, single episode, moderate: Secondary | ICD-10-CM

## 2014-04-30 MED ORDER — VENLAFAXINE HCL ER 150 MG PO TB24: 150.0000 mg | ORAL_TABLET | Freq: Every day | ORAL | Status: DC

## 2014-04-30 MED ORDER — ZOLPIDEM TARTRATE 5 MG PO TABS: ORAL_TABLET | ORAL | Status: DC

## 2014-04-30 NOTE — Progress Notes (Signed)
  [place] [phone] Progress Note  Kristen King [id number] 54 y.o.  04/30/2014 1:18 PM  Chief Complaint: Depression  History of Present Illness:  Kristen King comes in today to follow up and states she really is doing OK, but notes the stress of working for [employer] has become overwhelming to her. Her Father in law recently passed away, and she has been approached to interview for a new job. She states she is not sleeping as well lately, and can't seem to convey anything without becoming overly emotional.  Suicidal Ideation: No Plan Formed: No Patient has means to carry out plan: No  Homicidal Ideation: No Plan Formed: No Patient has means to carry out plan: No  Review of Systems: Psychiatric: Agitation: No Hallucination: No Depressed Mood: Yes Insomnia: No Hypersomnia: No Altered Concentration: No Feels Worthless: No Grandiose Ideas: No Belief In Special Powers: No New/Increased Substance Abuse: No Compulsions: No  Neurologic: Headache: No Seizure: No Paresthesias: No  Past Medical Family, Social History: States that she can no longer tolerate alcohol which is fine with her.  Outpatient Encounter Prescriptions as of 04/30/2014  Medication Sig  . Venlafaxine HCl 75 MG TB24 Take 1 tablet (75 mg total) by mouth every morning.    Past Psychiatric History/Hospitalization(s): Anxiety:  5/10 Bipolar Disorder: no Depression:  2/10 Mania: no Psychosis: no  Schizophrenia:  Personality Disorder:  Hospitalization for psychiatric illness:  History of Electroconvulsive Shock Therapy:  Prior Suicide Attempts: NA  Physical Exam: Constitutional:  BP 150/90 mmHg  Pulse 80  Ht 5' (1.524 m)  Wt 183 lb (83.008 kg)  BMI 35.74 kg/m2  General Appearance: alert, oriented, no acute distress  Musculoskeletal: Strength & Muscle Tone: within normal limits Gait & Station: normal Patient leans: N/A  Psychiatric: Speech (describe rate, volume, coherence, spontaneity, and  abnormalities if any):   Thought Process (describe rate, content, abstract reasoning, and computation):   Associations: Coherent and Relevant  Thoughts: normal  Mental Status: Orientation: oriented to person, place and time/date Mood & Affect: constricted and tearful but appropriate Attention Span & Concentration: normal  Medical Decision Making (Choose Three):   Assessment: Axis I: Major DD single episode moderate stable             Occupational problems             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     Plan:  1. For the depression will increase venlafaxine to 150mg  po qd. 2. For the insomnia will use low dose of Ambien 5 mg at hs. 3. Will recommend Vitamin D3 (5000 IU) and add B complex per day. 4. Will also recommend OPT for resolution of problem. 5. Follow up 3 weeks.    Lovenia Debruler, PA-C 04/30/2014

## 2014-04-30 NOTE — Patient Instructions (Signed)
1. Continue all medication as ordered. 2. Call this office if you have any questions or concerns. 3. Continue to get regular exercise 3-5 times a week. 4. Continue to eat a healthy nutritionally balanced diet. 5. Continue to reduce stress and anxiety through activities such as yoga, mindfulness, meditation and or prayer. 6. Keep all appointments with your out patient therapist and have notes forwarded to this office. (If you do not have one and would like to be scheduled with a therapist, please let our office assist you with this. 7. Follow up as planned 3 weeks.

## 2014-05-21 ENCOUNTER — Ambulatory Visit (HOSPITAL_COMMUNITY): Payer: Self-pay | Admitting: Physician Assistant

## 2014-06-02 ENCOUNTER — Other Ambulatory Visit (HOSPITAL_COMMUNITY): Payer: Self-pay | Admitting: Physician Assistant

## 2014-06-05 ENCOUNTER — Telehealth (HOSPITAL_COMMUNITY): Payer: Self-pay | Admitting: *Deleted

## 2014-06-05 MED ORDER — VENLAFAXINE HCL ER 150 MG PO TB24: 150.0000 mg | ORAL_TABLET | Freq: Every day | ORAL | Status: DC

## 2014-06-05 NOTE — Telephone Encounter (Signed)
Pt called for a refilled for Venlafaxine HCL 150mg . Per Nena Polio, PA-C, pt is authorized for a refill for Venlafaxine HCL 150mg , Qty 30. Prescription was sent to pharmacy. Pt has f/u appt on 5/3. Called and informed pt of prescription status. Pt states and shows understanding.

## 2014-06-06 ENCOUNTER — Ambulatory Visit (INDEPENDENT_AMBULATORY_CARE_PROVIDER_SITE_OTHER): Payer: 59 | Admitting: Licensed Clinical Social Worker

## 2014-06-06 DIAGNOSIS — F3340 Major depressive disorder, recurrent, in remission, unspecified: Secondary | ICD-10-CM | POA: Diagnosis not present

## 2014-06-06 DIAGNOSIS — Z569 Unspecified problems related to employment: Secondary | ICD-10-CM | POA: Diagnosis not present

## 2014-06-06 NOTE — Progress Notes (Signed)
Patient:   Kristen King   DOB:   20-Apr-1960  MR Number:  [id number]  Location:  [facility] [address] [place] [address] Dept: [phone]           Date of Service:   06/06/14  Start Time:   2:15pm End Time:   3:00pm  Provider/Observer:  [facility]       Billing Code/Service: [phone]  Comprehensive Clinical Assessment  Information for assessment provided by: patient   Chief Complaint:  Occupational stress       Presenting Problem/Symptoms:  "I've always had trouble with anxiety and depression, but I've always been able to deal with it up until now."     "I'm just not happy at my job.  I get very emotional about it."  She is a Marine scientist.  Has been with [employer] for 20 years.  Has a very good salary and benefits, but not happy with the company. Reports having conflict with management.  Sought help from HR, but still feels management is targeting her.        Mental Health Symptoms:    Depression: denies at this time  Past episodes of depression: yes  Anxiety: excessive worry     Panic Attacks: Absent   Self-Harm Potential: Thoughts of Self-Harm: none Method: na Availability of means: na   Dangerousness to Others Potential: Denies     Mania/hypomania: denies        Psychosis:  denies    Abuse/Trauma History: denies  PTSD symptoms: na      Mental Status  Interactions:    Active   Attention:   Good  Memory:   Intact  Speech:   Normal   Flow of Thought:  Normal  Thought Content:  WNL  Orientation:   person, place and time/date  Judgment:   Good  Affect/Mood:   Appropriate  Insight:   Good        Medical History:    Past Medical History  Diagnosis Date  . Anxiety   . Depression      Current medications:  Effexor 150mg  daily  Ran out of the medication in the last week.  Reports she has been going  through withdrawal, much more emotional than usual                 Mental Health/Substance Use Treatment History:    Started medication management last year     Family Med/Psych History: Mom-depression                                                  Dad-committed suicide    Substance Use History:   Alcohol- currently a couple beers over the weekend Former smoker       Marital Status:  Married to Kristen King for 37 years.  High school sweethearts  Lives with: husband  Family Relationships:   Daughter, Kristen King (25)- a nurse lives in [place], married with 4 children, talk often Son, Kristen King (68)- lives in [place], a Environmental education officer, has a fiancee, good relationship  Mom (76)- suffers from depression, needs a lot of encouragement, lives in [place] where Kristen King was raised, very close Dad committed suicide when Candelaria was 63 years old. Mom remarried a few  years later.  The marriage didn't last.  She is the oldest of 4 sisters.  "I sort of became the mother figure"   Very close with sisters.        Other Social Supports: recently distanced herself from her best friend     Current Employment: Full time nurse with [employer]   Works three 12 hour days                                                 "I don't feel challenged anymore."  Doesn't feel like she can talk to management.  The last year has been especially difficult.  Often feels like they push her buttons.     Education:   Scientist, water quality History:  None  Military Involvement: none  Religion/Spirituality:  Grew up going to church.  Does believe in a higher power.    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  Patient indicates she is satisfied with how she is coping with life stressors.  Anticipates that a change in employment will help considerably.                                        Continue medication management with Kristen Polio, PA-C.  Restart use of Effexor ASAP.

## 2014-06-18 ENCOUNTER — Ambulatory Visit (HOSPITAL_COMMUNITY): Payer: Self-pay | Admitting: Physician Assistant

## 2014-06-20 ENCOUNTER — Ambulatory Visit (HOSPITAL_COMMUNITY): Payer: Self-pay | Admitting: Licensed Clinical Social Worker

## 2014-06-24 ENCOUNTER — Encounter (HOSPITAL_COMMUNITY): Payer: Self-pay | Admitting: Physician Assistant

## 2014-06-24 ENCOUNTER — Ambulatory Visit (INDEPENDENT_AMBULATORY_CARE_PROVIDER_SITE_OTHER): Payer: 59 | Admitting: Physician Assistant

## 2014-06-24 VITALS — BP 128/78 | HR 84 | Ht 60.0 in | Wt 189.0 lb

## 2014-06-24 DIAGNOSIS — F5105 Insomnia due to other mental disorder: Secondary | ICD-10-CM

## 2014-06-24 DIAGNOSIS — F3340 Major depressive disorder, recurrent, in remission, unspecified: Secondary | ICD-10-CM

## 2014-06-24 MED ORDER — VENLAFAXINE HCL ER 150 MG PO TB24: 150.0000 mg | ORAL_TABLET | Freq: Every day | ORAL | Status: DC

## 2014-06-24 NOTE — Patient Instructions (Signed)
1. Take all of your medications as discussed with your provider. (Please check your AVS, for the list.) 2. Call this office for any questions or problems. 3. Be sure to get plenty of rest and try for 7-9 hours of quality sleep each night. 4. Try to get regular exercise, at least 15-30 minutes each day.  A good walk will help tremendously! 5. Remember to do your mindfulness each day, breath deeply in and out, while having quiet reflection, prayer, meditation, or positive visualization. Unplug and turn off all electronic devices each day for your own personal time without interruption. This works! There are studies to back this up! 6. Be sure to take your B complex and Vitamin D3 each day. This will improve your overall wellbeing and boost your immune system as well. 7. Try to eat a nutritious healthy diet and avoid excessive alcohol and ALL tobacco products. 8. Be sure to keep all of your appointments with your outpatient therapist. If you do not have one, our office will be happy to assist you with this. 9. Be sure to keep your next follow up appointment in 3 months. 

## 2014-06-24 NOTE — Progress Notes (Signed)
  [facility] [phone] Progress Note  Kristen King [id number] 53 y.o.  06/24/2014 5:09 PM  Chief Complaint: Depression  History of Present Illness:     Suicidal Ideation: No Plan Formed: No Patient has means to carry out plan: No  Homicidal Ideation: No Plan Formed: No Patient has means to carry out plan: No  Review of Systems: Psychiatric: Agitation: No Hallucination: No Depressed Mood: Yes Insomnia: No Hypersomnia: No Altered Concentration: No Feels Worthless: No Grandiose Ideas: No Belief In Special Powers: No New/Increased Substance Abuse: No Compulsions: No  Neurologic: Headache: No Seizure: No Paresthesias: No  Past Medical Family, Social History: States that she can no longer tolerate alcohol which is fine with her.  Outpatient Encounter Prescriptions as of 04/30/2014  Medication Sig  . Venlafaxine HCl 75 MG TB24 Take 1 tablet (75 mg total) by mouth every morning.    Past Psychiatric History/Hospitalization(s): Anxiety:  5/10 Bipolar Disorder: no Depression:  2/10 Mania: no Psychosis: no  Schizophrenia:  Personality Disorder:  Hospitalization for psychiatric illness:  History of Electroconvulsive Shock Therapy:  Prior Suicide Attempts: NA  Physical Exam: Constitutional:  BP 128/78 mmHg  Pulse 84  Ht 5' (1.524 m)  Wt 189 lb (85.73 kg)  BMI 36.91 kg/m2  General Appearance: alert, oriented, no acute distress  Musculoskeletal: Strength & Muscle Tone: within normal limits Gait & Station: normal Patient leans: N/A  Psychiatric: Speech (describe rate, volume, coherence, spontaneity, and abnormalities if any):   Thought Process (describe rate, content, abstract reasoning, and computation):   Associations: Coherent and Relevant  Thoughts: normal  Mental Status: Orientation: oriented to person, place and time/date Mood & Affect: constricted and tearful but appropriate Attention Span & Concentration: normal  Medical Decision  Making (Choose Three):   Assessment: Axis I: Major DD single episode moderate stable             Occupational problems             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     Plan:  1. For the depression will increase venlafaxine to 150mg  po qd. 2. For the insomnia will use low dose of Ambien 5 mg at hs. 3. Will recommend Vitamin D3 (5000 IU) and add B complex per day. 4. Will also recommend OPT for resolution of problem. 5. Follow up 3 months.   Whalen Trompeter, PA-C 06/24/2014

## 2014-07-04 ENCOUNTER — Ambulatory Visit (HOSPITAL_COMMUNITY): Payer: Self-pay | Admitting: Licensed Clinical Social Worker

## 2014-09-12 ENCOUNTER — Ambulatory Visit (INDEPENDENT_AMBULATORY_CARE_PROVIDER_SITE_OTHER): Payer: 59 | Admitting: Physician Assistant

## 2014-09-12 ENCOUNTER — Encounter (HOSPITAL_COMMUNITY): Payer: Self-pay | Admitting: Physician Assistant

## 2014-09-12 VITALS — BP 126/70 | HR 78 | Ht 60.0 in | Wt 187.0 lb

## 2014-09-12 DIAGNOSIS — Z569 Unspecified problems related to employment: Secondary | ICD-10-CM

## 2014-09-12 DIAGNOSIS — F5105 Insomnia due to other mental disorder: Secondary | ICD-10-CM

## 2014-09-12 DIAGNOSIS — F321 Major depressive disorder, single episode, moderate: Secondary | ICD-10-CM | POA: Diagnosis not present

## 2014-09-12 DIAGNOSIS — F3340 Major depressive disorder, recurrent, in remission, unspecified: Secondary | ICD-10-CM

## 2014-09-12 MED ORDER — VENLAFAXINE HCL ER 150 MG PO TB24: 150.0000 mg | ORAL_TABLET | Freq: Every day | ORAL | Status: DC

## 2014-09-12 NOTE — Progress Notes (Signed)
[facility] [phone] Progress Note  Kristen King [id number] 54 y.o.  09/12/2014 2:31 PM  Chief Complaint: Depression  History of Present Illness:        Kristen King is in to follow up on her depression. Her symptoms are currently stable and she is doing well despite some set backs at work.        She reports no new symptoms and is coping well with the harassment she is getting at work.   Suicidal Ideation: No Plan Formed: No Patient has means to carry out plan: No  Homicidal Ideation: No Plan Formed: No Patient has means to carry out plan: No  Review of Systems: Psychiatric: Agitation: No Hallucination: No Depressed Mood: Yes Insomnia: No Hypersomnia: No Altered Concentration: No Feels Worthless: No Grandiose Ideas: No Belief In Special Powers: No New/Increased Substance Abuse: No Compulsions: No  Neurologic: Headache: No Seizure: No Paresthesias: No  Past Medical Family, Social History:  No changes   Outpatient Encounter Prescriptions as of 04/30/2014  Medication Sig  . Venlafaxine HCl 75 MG TB24 Take 1 tablet (75 mg total) by mouth every morning.     Physical Exam: Constitutional:  BP 126/70 mmHg  Pulse 78  Ht 5' (1.524 m)  Wt 187 lb (84.823 kg)  BMI 36.52 kg/m2  SpO2 98%    Total Time spent with patient: 30 minutes Musculoskeletal: Strength & Muscle Tone: within normal limits Gait & Station: normal Patient leans: N/A   Psychiatric Specialty Exam: Physical Exam  ROS  Blood pressure 126/70, pulse 78, height 5' (1.524 m), weight 187 lb (84.823 kg), SpO2 98 %.Body mass index is 36.52 kg/(m^2).  General Appearance: Neat and Well Groomed  Engineer, water::  Good  Speech:  Clear and Coherent  Volume:  Normal  Mood:  Euthymic  Affect:  Appropriate and Congruent  Thought Process:  Coherent, Goal Directed and Intact  Orientation:  Full (Time, Place, and Person)  Thought Content:  WDL  Suicidal Thoughts:  No  Homicidal Thoughts:  No  Memory:  NA   Judgement:  Intact  Insight:  Good  Psychomotor Activity:  Normal  Concentration:  Good  Recall:  Good  Fund of Knowledge:Good  Language: Good  Akathisia:  Negative  Handed:  Right  AIMS (if indicated):     Assets:  Communication Skills Desire for Improvement Financial Resources/Insurance Housing Leisure Time [place] Talents/Skills Transportation Vocational/Educational  Sleep:      Medical Decision Making (Choose Three):   Assessment: stable  Major DD single episode moderate stable  Occupational problems  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  resolved   Plan:  1. Continue Venlafaxine 150 mg as written x 3 months + 1RF. 2. As this patient is very stable and coping well with her stress, demonstrating good resolution of symptoms despite continuing adversity at work, she is able to be followed by her PCP at this point. 3. As this provider is leaving the practice this patient can be transferred back to the care of her PCP for further follow up treatment. 4. Should the need arise I am confident that this RN will be able to recognize the need to return to psychiatry and act accordingly.  Medications are written as documented. 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   Amoree Newlon, PA-C 09/12/2014

## 2014-09-23 ENCOUNTER — Ambulatory Visit (HOSPITAL_COMMUNITY): Payer: Self-pay | Admitting: Physician Assistant

## 2014-10-03 ENCOUNTER — Other Ambulatory Visit (HOSPITAL_COMMUNITY): Payer: Self-pay | Admitting: Physician Assistant

## 2015-04-06 MED FILL — VENLAFAXINE HCL ER 150 MG C: 150 | 90 days supply | Qty: 90 | Fill #0

## 2015-05-28 DIAGNOSIS — H25012 Cortical age-related cataract, left eye: Secondary | ICD-10-CM | POA: Diagnosis not present

## 2015-05-28 DIAGNOSIS — H52223 Regular astigmatism, bilateral: Secondary | ICD-10-CM | POA: Diagnosis not present

## 2015-05-28 DIAGNOSIS — H5203 Hypermetropia, bilateral: Secondary | ICD-10-CM | POA: Diagnosis not present

## 2015-05-28 DIAGNOSIS — H524 Presbyopia: Secondary | ICD-10-CM | POA: Diagnosis not present

## 2015-06-11 DIAGNOSIS — H2513 Age-related nuclear cataract, bilateral: Secondary | ICD-10-CM | POA: Diagnosis not present

## 2015-06-11 DIAGNOSIS — H43813 Vitreous degeneration, bilateral: Secondary | ICD-10-CM | POA: Diagnosis not present

## 2015-07-13 MED FILL — VENLAFAXINE HCL ER 150 MG C: 150 | 90 days supply | Qty: 90 | Fill #1

## 2015-08-17 DIAGNOSIS — I10 Essential (primary) hypertension: Secondary | ICD-10-CM | POA: Diagnosis not present

## 2015-08-17 DIAGNOSIS — E785 Hyperlipidemia, unspecified: Secondary | ICD-10-CM | POA: Diagnosis not present

## 2015-08-17 DIAGNOSIS — F411 Generalized anxiety disorder: Secondary | ICD-10-CM | POA: Diagnosis not present

## 2015-08-17 DIAGNOSIS — R8299 Other abnormal findings in urine: Secondary | ICD-10-CM | POA: Diagnosis not present

## 2015-08-17 MED FILL — LISINOPRIL 10 MG TABLET: 10 | 30 days supply | Qty: 30 | Fill #0 | Status: TO

## 2015-08-21 DIAGNOSIS — S8264XA Nondisplaced fracture of lateral malleolus of right fibula, initial encounter for closed fracture: Secondary | ICD-10-CM | POA: Diagnosis not present

## 2015-08-21 DIAGNOSIS — M79671 Pain in right foot: Secondary | ICD-10-CM | POA: Diagnosis not present

## 2015-08-21 DIAGNOSIS — S93421A Sprain of deltoid ligament of right ankle, initial encounter: Secondary | ICD-10-CM | POA: Diagnosis not present

## 2015-08-21 DIAGNOSIS — S8261XA Displaced fracture of lateral malleolus of right fibula, initial encounter for closed fracture: Secondary | ICD-10-CM | POA: Diagnosis not present

## 2015-08-21 DIAGNOSIS — M25571 Pain in right ankle and joints of right foot: Secondary | ICD-10-CM | POA: Diagnosis not present

## 2015-08-21 DIAGNOSIS — Z6835 Body mass index (BMI) 35.0-35.9, adult: Secondary | ICD-10-CM | POA: Diagnosis not present

## 2015-08-21 DIAGNOSIS — S93491A Sprain of other ligament of right ankle, initial encounter: Secondary | ICD-10-CM | POA: Diagnosis not present

## 2015-08-24 ENCOUNTER — Encounter (HOSPITAL_BASED_OUTPATIENT_CLINIC_OR_DEPARTMENT_OTHER): Payer: Self-pay | Admitting: *Deleted

## 2015-08-24 ENCOUNTER — Other Ambulatory Visit: Payer: Self-pay | Admitting: Orthopedic Surgery

## 2015-08-24 DIAGNOSIS — S8261XA Displaced fracture of lateral malleolus of right fibula, initial encounter for closed fracture: Secondary | ICD-10-CM | POA: Diagnosis not present

## 2015-08-27 ENCOUNTER — Encounter (HOSPITAL_BASED_OUTPATIENT_CLINIC_OR_DEPARTMENT_OTHER)
Admission: RE | Disposition: A | Discharge status: Home / Self Care | Payer: Self-pay | Source: Ambulatory Visit | Attending: Orthopedic Surgery

## 2015-08-27 ENCOUNTER — Encounter (HOSPITAL_BASED_OUTPATIENT_CLINIC_OR_DEPARTMENT_OTHER): Payer: Self-pay | Admitting: *Deleted

## 2015-08-27 ENCOUNTER — Ambulatory Visit (HOSPITAL_BASED_OUTPATIENT_CLINIC_OR_DEPARTMENT_OTHER): Payer: 59 | Admitting: Anesthesiology

## 2015-08-27 ENCOUNTER — Ambulatory Visit (HOSPITAL_BASED_OUTPATIENT_CLINIC_OR_DEPARTMENT_OTHER)
Admission: RE | Admit: 2015-08-27 | Discharge: 2015-08-27 | Disposition: A | Discharge status: Home / Self Care | Payer: 59 | Source: Ambulatory Visit | Attending: Orthopedic Surgery | Admitting: Orthopedic Surgery

## 2015-08-27 DIAGNOSIS — Z87891 Personal history of nicotine dependence: Secondary | ICD-10-CM | POA: Diagnosis not present

## 2015-08-27 DIAGNOSIS — Z885 Allergy status to narcotic agent status: Secondary | ICD-10-CM | POA: Diagnosis not present

## 2015-08-27 DIAGNOSIS — S8261XA Displaced fracture of lateral malleolus of right fibula, initial encounter for closed fracture: Secondary | ICD-10-CM | POA: Diagnosis not present

## 2015-08-27 DIAGNOSIS — W19XXXA Unspecified fall, initial encounter: Secondary | ICD-10-CM | POA: Diagnosis not present

## 2015-08-27 DIAGNOSIS — I10 Essential (primary) hypertension: Secondary | ICD-10-CM | POA: Insufficient documentation

## 2015-08-27 DIAGNOSIS — G8918 Other acute postprocedural pain: Secondary | ICD-10-CM | POA: Diagnosis not present

## 2015-08-27 DIAGNOSIS — S82841A Displaced bimalleolar fracture of right lower leg, initial encounter for closed fracture: Secondary | ICD-10-CM | POA: Diagnosis not present

## 2015-08-27 DIAGNOSIS — M25571 Pain in right ankle and joints of right foot: Secondary | ICD-10-CM | POA: Diagnosis not present

## 2015-08-27 HISTORY — PX: ORIF ANKLE FRACTURE: SHX5408

## 2015-08-27 HISTORY — DX: Other fracture of unspecified lower leg, initial encounter for closed fracture: S82.899A

## 2015-08-27 HISTORY — DX: Essential (primary) hypertension: I10

## 2015-08-27 SURGERY — OPEN REDUCTION INTERNAL FIXATION (ORIF) ANKLE FRACTURE
Anesthesia: General | Site: Ankle | Laterality: Right

## 2015-08-27 MED ORDER — LIDOCAINE 2% (20 MG/ML) 5 ML SYRINGE
INTRAMUSCULAR | Status: AC
  Filled 2015-08-27: qty 5

## 2015-08-27 MED ORDER — PROPOFOL 10 MG/ML IV BOLUS
INTRAVENOUS | Status: DC | PRN
  Administered 2015-08-27: 200 mg via INTRAVENOUS

## 2015-08-27 MED ORDER — DEXAMETHASONE SODIUM PHOSPHATE 10 MG/ML IJ SOLN
INTRAMUSCULAR | Status: DC | PRN
  Administered 2015-08-27: 10 mg via INTRAVENOUS

## 2015-08-27 MED ORDER — LIDOCAINE 2% (20 MG/ML) 5 ML SYRINGE
INTRAMUSCULAR | Status: DC | PRN
  Administered 2015-08-27: 80 mg via INTRAVENOUS

## 2015-08-27 MED ORDER — SODIUM CHLORIDE 0.9 % IV SOLN: INTRAVENOUS | Status: DC

## 2015-08-27 MED ORDER — ONDANSETRON HCL 4 MG/2ML IJ SOLN
INTRAMUSCULAR | Status: AC
  Filled 2015-08-27: qty 2

## 2015-08-27 MED ORDER — CEFAZOLIN SODIUM-DEXTROSE 2-4 GM/100ML-% IV SOLN
INTRAVENOUS | Status: AC
  Filled 2015-08-27: qty 100

## 2015-08-27 MED ORDER — FENTANYL CITRATE (PF) 100 MCG/2ML IJ SOLN
INTRAMUSCULAR | Status: AC
  Filled 2015-08-27: qty 2

## 2015-08-27 MED ORDER — DEXAMETHASONE SODIUM PHOSPHATE 10 MG/ML IJ SOLN
INTRAMUSCULAR | Status: AC
  Filled 2015-08-27: qty 1

## 2015-08-27 MED ORDER — PROPOFOL 10 MG/ML IV BOLUS
INTRAVENOUS | Status: AC
  Filled 2015-08-27: qty 20

## 2015-08-27 MED ORDER — BUPIVACAINE-EPINEPHRINE (PF) 0.5% -1:200000 IJ SOLN
INTRAMUSCULAR | Status: DC | PRN
  Administered 2015-08-27: 30 mL via PERINEURAL

## 2015-08-27 MED ORDER — LACTATED RINGERS IV SOLN
INTRAVENOUS | Status: DC
  Administered 2015-08-27 (×2): via INTRAVENOUS

## 2015-08-27 MED ORDER — OXYCODONE HCL 5 MG PO TABS: 5.0000 mg | ORAL_TABLET | ORAL | Status: DC | PRN

## 2015-08-27 MED ORDER — FENTANYL CITRATE (PF) 100 MCG/2ML IJ SOLN: 25.0000 ug | INTRAMUSCULAR | Status: DC | PRN

## 2015-08-27 MED ORDER — PROMETHAZINE HCL 25 MG/ML IJ SOLN: 6.2500 mg | INTRAMUSCULAR | Status: DC | PRN

## 2015-08-27 MED ORDER — FENTANYL CITRATE (PF) 100 MCG/2ML IJ SOLN
50.0000 ug | INTRAMUSCULAR | Status: DC | PRN
  Administered 2015-08-27 (×2): 50 ug via INTRAVENOUS

## 2015-08-27 MED ORDER — CHLORHEXIDINE GLUCONATE 4 % EX LIQD: 60.0000 mL | Freq: Once | CUTANEOUS | Status: DC

## 2015-08-27 MED ORDER — GLYCOPYRROLATE 0.2 MG/ML IJ SOLN: 0.2000 mg | Freq: Once | INTRAMUSCULAR | Status: DC | PRN

## 2015-08-27 MED ORDER — MIDAZOLAM HCL 2 MG/2ML IJ SOLN
INTRAMUSCULAR | Status: AC
  Filled 2015-08-27: qty 2

## 2015-08-27 MED ORDER — ONDANSETRON HCL 4 MG/2ML IJ SOLN
INTRAMUSCULAR | Status: DC | PRN
  Administered 2015-08-27: 4 mg via INTRAVENOUS

## 2015-08-27 MED ORDER — CEFAZOLIN SODIUM-DEXTROSE 2-4 GM/100ML-% IV SOLN
2.0000 g | INTRAVENOUS | Status: AC
  Administered 2015-08-27: 2 g via INTRAVENOUS

## 2015-08-27 MED ORDER — SCOPOLAMINE 1 MG/3DAYS TD PT72: 1.0000 | MEDICATED_PATCH | Freq: Once | TRANSDERMAL | Status: DC | PRN

## 2015-08-27 MED ORDER — MIDAZOLAM HCL 2 MG/2ML IJ SOLN
1.0000 mg | INTRAMUSCULAR | Status: DC | PRN
  Administered 2015-08-27: 2 mg via INTRAVENOUS

## 2015-08-27 SURGICAL SUPPLY — 73 items
BANDAGE ESMARK 6X9 LF (GAUZE/BANDAGES/DRESSINGS) ×1 IMPLANT
BIT DRILL 2.5X2.75 QC CALB (BIT) ×2 IMPLANT
BIT DRILL 3.5X5.5 QC CALB (BIT) ×2 IMPLANT
BLADE SURG 15 STRL LF DISP TIS (BLADE) ×2 IMPLANT
BLADE SURG 15 STRL SS (BLADE) ×9
BNDG CMPR 9X4 STRL LF SNTH (GAUZE/BANDAGES/DRESSINGS)
BNDG CMPR 9X6 STRL LF SNTH (GAUZE/BANDAGES/DRESSINGS) ×1
BNDG COHESIVE 4X5 TAN STRL (GAUZE/BANDAGES/DRESSINGS) ×3 IMPLANT
BNDG COHESIVE 6X5 TAN STRL LF (GAUZE/BANDAGES/DRESSINGS) ×3 IMPLANT
BNDG ESMARK 4X9 LF (GAUZE/BANDAGES/DRESSINGS) IMPLANT
BNDG ESMARK 6X9 LF (GAUZE/BANDAGES/DRESSINGS) ×3
CANISTER SUCT 1200ML W/VALVE (MISCELLANEOUS) ×3 IMPLANT
CHLORAPREP W/TINT 26ML (MISCELLANEOUS) ×3 IMPLANT
COVER BACK TABLE 60X90IN (DRAPES) ×3 IMPLANT
CUFF TOURNIQUET SINGLE 34IN LL (TOURNIQUET CUFF) ×2 IMPLANT
DECANTER SPIKE VIAL GLASS SM (MISCELLANEOUS) IMPLANT
DRAPE EXTREMITY T 121X128X90 (DRAPE) ×3 IMPLANT
DRAPE OEC MINIVIEW 54X84 (DRAPES) ×3 IMPLANT
DRAPE U-SHAPE 47X51 STRL (DRAPES) ×3 IMPLANT
DRSG MEPITEL 4X7.2 (GAUZE/BANDAGES/DRESSINGS) ×3 IMPLANT
DRSG PAD ABDOMINAL 8X10 ST (GAUZE/BANDAGES/DRESSINGS) ×6 IMPLANT
ELECT REM PT RETURN 9FT ADLT (ELECTROSURGICAL) ×3
ELECTRODE REM PT RTRN 9FT ADLT (ELECTROSURGICAL) ×1 IMPLANT
GAUZE SPONGE 4X4 12PLY STRL (GAUZE/BANDAGES/DRESSINGS) ×3 IMPLANT
GLOVE BIO SURGEON STRL SZ8 (GLOVE) ×3 IMPLANT
GLOVE BIOGEL M STRL SZ7.5 (GLOVE) ×2 IMPLANT
GLOVE BIOGEL PI IND STRL 7.0 (GLOVE) IMPLANT
GLOVE BIOGEL PI IND STRL 8 (GLOVE) ×2 IMPLANT
GLOVE BIOGEL PI INDICATOR 7.0 (GLOVE) ×2
GLOVE BIOGEL PI INDICATOR 8 (GLOVE) ×4
GLOVE ECLIPSE 7.5 STRL STRAW (GLOVE) ×1 IMPLANT
GLOVE EXAM NITRILE MD LF STRL (GLOVE) IMPLANT
GOWN STRL REUS W/ TWL LRG LVL3 (GOWN DISPOSABLE) ×1 IMPLANT
GOWN STRL REUS W/ TWL XL LVL3 (GOWN DISPOSABLE) ×2 IMPLANT
GOWN STRL REUS W/TWL LRG LVL3 (GOWN DISPOSABLE)
GOWN STRL REUS W/TWL XL LVL3 (GOWN DISPOSABLE) ×6
NEEDLE HYPO 22GX1.5 SAFETY (NEEDLE) IMPLANT
NS IRRIG 1000ML POUR BTL (IV SOLUTION) ×3 IMPLANT
PACK BASIN DAY SURGERY FS (CUSTOM PROCEDURE TRAY) ×3 IMPLANT
PAD CAST 4YDX4 CTTN HI CHSV (CAST SUPPLIES) ×1 IMPLANT
PADDING CAST ABS 4INX4YD NS (CAST SUPPLIES)
PADDING CAST ABS COTTON 4X4 ST (CAST SUPPLIES) IMPLANT
PADDING CAST COTTON 4X4 STRL (CAST SUPPLIES) ×3
PADDING CAST COTTON 6X4 STRL (CAST SUPPLIES) ×3 IMPLANT
PENCIL BUTTON HOLSTER BLD 10FT (ELECTRODE) ×3 IMPLANT
PLATE ACE 100DEG 6HOLE (Plate) ×2 IMPLANT
SANITIZER HAND PURELL 535ML FO (MISCELLANEOUS) ×3 IMPLANT
SCREW CORTICAL 3.5MM  16MM (Screw) ×4 IMPLANT
SCREW CORTICAL 3.5MM 14MM (Screw) ×8 IMPLANT
SCREW CORTICAL 3.5MM 16MM (Screw) IMPLANT
SCREW CORTICAL 3.5MM 24MM (Screw) ×2 IMPLANT
SHEET MEDIUM DRAPE 40X70 STRL (DRAPES) ×3 IMPLANT
SLEEVE SCD COMPRESS KNEE MED (MISCELLANEOUS) ×3 IMPLANT
SPLINT FAST PLASTER 5X30 (CAST SUPPLIES) ×40
SPLINT PLASTER CAST FAST 5X30 (CAST SUPPLIES) ×20 IMPLANT
SPONGE LAP 18X18 X RAY DECT (DISPOSABLE) ×3 IMPLANT
STOCKINETTE 6  STRL (DRAPES) ×2
STOCKINETTE 6 STRL (DRAPES) ×1 IMPLANT
SUCTION FRAZIER HANDLE 10FR (MISCELLANEOUS) ×2
SUCTION TUBE FRAZIER 10FR DISP (MISCELLANEOUS) ×1 IMPLANT
SUT ETHILON 3 0 PS 1 (SUTURE) ×3 IMPLANT
SUT FIBERWIRE #2 38 T-5 BLUE (SUTURE)
SUT MNCRL AB 3-0 PS2 18 (SUTURE) ×3 IMPLANT
SUT VIC AB 0 SH 27 (SUTURE) IMPLANT
SUT VIC AB 2-0 SH 27 (SUTURE) ×3
SUT VIC AB 2-0 SH 27XBRD (SUTURE) IMPLANT
SUTURE FIBERWR #2 38 T-5 BLUE (SUTURE) IMPLANT
SYR BULB 3OZ (MISCELLANEOUS) ×3 IMPLANT
SYR CONTROL 10ML LL (SYRINGE) IMPLANT
TOWEL OR 17X24 6PK STRL BLUE (TOWEL DISPOSABLE) ×6 IMPLANT
TUBE CONNECTING 20'X1/4 (TUBING) ×1
TUBE CONNECTING 20X1/4 (TUBING) ×2 IMPLANT
UNDERPAD 30X30 (UNDERPADS AND DIAPERS) ×3 IMPLANT

## 2015-08-27 NOTE — Anesthesia Procedure Notes (Addendum)
Procedure Name: LMA Insertion Date/Time: 08/27/2015 3:53 PM Performed by: Lieutenant Diego Pre-anesthesia Checklist: Patient identified, Emergency Drugs available, Suction available and Patient being monitored Patient Re-evaluated:Patient Re-evaluated prior to inductionOxygen Delivery Method: Circle system utilized Preoxygenation: Pre-oxygenation with 100% oxygen Intubation Type: IV induction Ventilation: Mask ventilation without difficulty LMA: LMA inserted LMA Size: 4.0 Number of attempts: 1 Airway Equipment and Method: Bite block Placement Confirmation: positive ETCO2 and breath sounds checked- equal and bilateral Tube secured with: Tape Dental Injury: Teeth and Oropharynx as per pre-operative assessment    Anesthesia Regional Block:  Popliteal block  Pre-Anesthetic Checklist: ,, timeout performed, Correct Patient, Correct Site, Correct Laterality, Correct Procedure, Correct Position, site marked, Risks and benefits discussed, Surgical consent,  Pre-op evaluation,  Post-op pain management  Laterality: Right and Lower  Prep: chloraprep       Needles:  Injection technique: Single-shot     Needle Length: 10cm 10 cm Needle Gauge: 21 and 21 G    Additional Needles:  Procedures: ultrasound guided (picture in chart) Popliteal block Narrative:  Injection made incrementally with aspirations every 5 mL.  Performed by: Personally  Anesthesiologist: Franne Grip  Additional Notes: Tolerated well. No pain with injection. No increased pressure with injection.

## 2015-08-27 NOTE — Anesthesia Preprocedure Evaluation (Signed)
Anesthesia Evaluation  Patient identified by MRN, date of birth, ID band Patient awake    Reviewed: Allergy & Precautions, NPO status , Patient's Chart, lab work & pertinent test results  Airway Mallampati: II  TM Distance: >3 FB Neck ROM: Full    Dental no notable dental hx.    Pulmonary neg pulmonary ROS, former smoker,    Pulmonary exam normal breath sounds clear to auscultation       Cardiovascular hypertension, Pt. on medications Normal cardiovascular exam Rhythm:Regular Rate:Normal     Neuro/Psych PSYCHIATRIC DISORDERS Anxiety Depression negative neurological ROS     GI/Hepatic negative GI ROS, Neg liver ROS,   Endo/Other  negative endocrine ROS  Renal/GU negative Renal ROS  negative genitourinary   Musculoskeletal negative musculoskeletal ROS (+)   Abdominal (+) + obese,   Peds negative pediatric ROS (+)  Hematology negative hematology ROS (+)   Anesthesia Other Findings   Reproductive/Obstetrics negative OB ROS                             Anesthesia Physical Anesthesia Plan  ASA: II  Anesthesia Plan: General   Post-op Pain Management:    Induction: Intravenous  Airway Management Planned: LMA  Additional Equipment:   Intra-op Plan:   Post-operative Plan: Extubation in OR  Informed Consent: I have reviewed the patients History and Physical, chart, labs and discussed the procedure including the risks, benefits and alternatives for the proposed anesthesia with the patient or authorized representative who has indicated his/her understanding and acceptance.   Dental advisory given  Plan Discussed with: CRNA  Anesthesia Plan Comments:         Anesthesia Quick Evaluation

## 2015-08-27 NOTE — Brief Op Note (Signed)
08/27/2015  4:35 PM  PATIENT:  Kristen King  54 y.o. female  PRE-OPERATIVE DIAGNOSIS:  right ankle lateral malleolus fracture  POST-OPERATIVE DIAGNOSIS:  same  Procedure(s): 1.  OPEN REDUCTION INTERNAL FIXATION (ORIF) RIGHT ANKLE  MALLEOLUS FRACTURE  2.  Stress exam of right ankle under fluoro 3.  AP, mortise and lateral xrays of the right ankle  SURGEON:  Wylene Simmer, MD  ASSISTANT: n/a  ANESTHESIA:   General, regional  EBL:  minimal   TOURNIQUET:   Total Tourniquet Time Documented: Thigh (Right) - 22 minutes Total: Thigh (Right) - 22 minutes  COMPLICATIONS:  None apparent  DISPOSITION:  Extubated, awake and stable to recovery.  DICTATION ID:  [id number]

## 2015-08-27 NOTE — H&P (Signed)
Kristen King is an 55 y.o. female.   Chief Complaint: right ankle pain HPI: 55 y/o female with right ankle lateral malleolus fracture after a fall last week.  She presents today for operative treatment of this displaced and unstable injury.  Past Medical History  Diagnosis Date  . Anxiety   . Depression   . Hypertension     just started lisinopril 1 wk ago  . Ankle fracture     right    Past Surgical History  Procedure Laterality Date  . Abdominal hysterectomy  2001  . Oophorectomy Bilateral 2006    History reviewed. No pertinent family history. Social History:  reports that she has quit smoking. She does not have any smokeless tobacco history on file. She reports that she drinks about 2.2 oz of alcohol per week. She reports that she does not use illicit drugs.  Allergies:  Allergies  Allergen Reactions  . Meperidine Hcl Itching  . Morphine Itching  . Novocain [Procaine] Swelling    Medications Prior to Admission  Medication Sig Dispense Refill  . Cholecalciferol (VITAMIN D-3) 1000 units CAPS Take by mouth.    . cyanocobalamin 500 MCG tablet Take 500 mcg by mouth daily.    Marland Kitchen lisinopril (PRINIVIL,ZESTRIL) 10 MG tablet Take 10 mg by mouth daily.    . Multiple Vitamin (MULTIVITAMIN WITH MINERALS) TABS tablet Take 1 tablet by mouth daily.    . Venlafaxine HCl 150 MG TB24 Take 1 tablet (150 mg total) by mouth daily. (Patient taking differently: Take 100 mg by mouth daily. ) 90 each 1    No results found for this or any previous visit (from the past 48 hour(s)). No results found.  ROS  No recent f/c/n/v/wt loss  Blood pressure 118/68, pulse 78, temperature 97.8 F (36.6 C), temperature source Oral, resp. rate 19, height 5' (1.524 m), weight 83.915 kg (185 lb), SpO2 100 %. Physical Exam  wn wd woman in nad.  A and O x 4.  Mood and affect normal.  EOMI.  resp unlabored.  R ankle with healthy skin.  Mild swelling.  No lymphadenopathy.  5/5 strength in PF and DF of the toes.   Sens to LT intact at the forefoot.  Assessment/Plan R ankle lateral malleolus fracture - to OR for ORIF of the lateral malleolus.  The risks and benefits of the alternative treatment options have been discussed in detail.  The patient wishes to proceed with surgery and specifically understands risks of bleeding, infection, nerve damage, blood clots, need for additional surgery, amputation and death.   Wylene Simmer, MD 07-Sep-2015, 3:28 PM

## 2015-08-27 NOTE — Progress Notes (Signed)
Assisted Dr. Delma Post with right, ultrasound guided, popliteal/saphenous block. Side rails up, monitors on throughout procedure. See vital signs in flow sheet. Tolerated Procedure well.

## 2015-08-27 NOTE — Transfer of Care (Signed)
Immediate Anesthesia Transfer of Care Note  Patient: Kristen King  Procedure(s) Performed: Procedure(s): OPEN REDUCTION INTERNAL FIXATION (ORIF) RIGHT ANKLE  MALLEOLUS FRACTURE WITH POSSIBLE DELTOID LIGAMENT REPAIR (Right)  Patient Location: PACU  Anesthesia Type:GA combined with regional for post-op pain  Level of Consciousness: awake, alert  and oriented  Airway & Oxygen Therapy: Patient Spontanous Breathing and Patient connected to face mask oxygen  Post-op Assessment: Report given to RN and Post -op Vital signs reviewed and stable  Post vital signs: Reviewed and stable  Last Vitals:  Filed Vitals:   08/27/15 1530 08/27/15 1544  BP: 124/67   Pulse: 82 83  Temp:    Resp: 18 18    Last Pain:  Filed Vitals:   08/27/15 1546  PainSc: 4          Complications: No apparent anesthesia complications

## 2015-08-27 NOTE — Anesthesia Postprocedure Evaluation (Signed)
Anesthesia Post Note  Patient: DONECIA DAKER  Procedure(s) Performed: Procedure(s) (LRB): OPEN REDUCTION INTERNAL FIXATION (ORIF) RIGHT ANKLE  MALLEOLUS FRACTURE  (Right)  Patient location during evaluation: PACU Anesthesia Type: General Level of consciousness: awake and alert Pain management: pain level controlled Vital Signs Assessment: post-procedure vital signs reviewed and stable Respiratory status: spontaneous breathing, nonlabored ventilation, respiratory function stable and patient connected to nasal cannula oxygen Cardiovascular status: blood pressure returned to baseline and stable Postop Assessment: no signs of nausea or vomiting Anesthetic complications: no    Last Vitals:  Filed Vitals:   08/27/15 1715 08/27/15 1800  BP: 122/73 152/69  Pulse: 84 81  Temp:  36.6 C  Resp: 22 18    Last Pain:  Filed Vitals:   08/27/15 1807  PainSc: 0-No pain                 Phoenyx Melka J

## 2015-08-27 NOTE — Discharge Instructions (Signed)
Kristen Simmer, MD [place]  Please read the following information regarding your care after surgery.  Medications  You only need a prescription for the narcotic pain medicine (ex. oxycodone, Percocet, Norco).  All of the other medicines listed below are available over the counter. X acetominophen (Tylenol) 650 mg every 4-6 hours as you need for minor pain X oxycodone as prescribed for moderate to severe pain ?   Narcotic pain medicine (ex. oxycodone, Percocet, Vicodin) will cause constipation.  To prevent this problem, take the following medicines while you are taking any pain medicine. X docusate sodium (Colace) 100 mg twice a day X senna (Senokot) 2 tablets twice a day  X To help prevent blood clots, take a baby aspirin (81 mg) twice a day for two weeks after surgery.  You should also get up every hour while you are awake to move around.    Weight Bearing ? Bear weight when you are able on your operated leg or foot. ? Bear weight only on the heel of your operated foot in the post-op shoe. X Do not bear any weight on the operated leg or foot.  Cast / Splint / Dressing X Keep your splint or cast clean and dry.  Dont put anything (coat hanger, pencil, etc) down inside of it.  If it gets damp, use a hair dryer on the cool setting to dry it.  If it gets soaked, call the office to schedule an appointment for a cast change. ? Remove your dressing 3 days after surgery and cover the incisions with dry dressings.    After your dressing, cast or splint is removed; you may shower, but do not soak or scrub the wound.  Allow the water to run over it, and then gently pat it dry.  Swelling It is normal for you to have swelling where you had surgery.  To reduce swelling and pain, keep your toes above your nose for at least 3 days after surgery.  It may be necessary to keep your foot or leg elevated for several weeks.  If it hurts, it should be elevated.  Follow Up Call my office at  [phone] when you are discharged from the hospital or surgery center to schedule an appointment to be seen two weeks after surgery.  Call my office at [phone] if you develop a fever >101.5 F, nausea, vomiting, bleeding from the surgical site or severe pain.    Regional Anesthesia Blocks  1. Numbness or the inability to move the "blocked" extremity may last from 3-48 hours after placement. The length of time depends on the medication injected and your individual response to the medication. If the numbness is not going away after 48 hours, call your surgeon.  2. The extremity that is blocked will need to be protected until the numbness is gone and the  Strength has returned. Because you cannot feel it, you will need to take extra care to avoid injury. Because it may be weak, you may have difficulty moving it or using it. You may not know what position it is in without looking at it while the block is in effect.  3. For blocks in the legs and feet, returning to weight bearing and walking needs to be done carefully. You will need to wait until the numbness is entirely gone and the strength has returned. You should be able to move your leg and foot normally before you try and bear weight or walk. You will need someone to be  with you when you first try to ensure you do not fall and possibly risk injury.  4. Bruising and tenderness at the needle site are common side effects and will resolve in a few days.  5. Persistent numbness or new problems with movement should be communicated to the surgeon or the [facility] [phone] [place] [phone]).  Post Anesthesia Home Care Instructions  Activity: Get plenty of rest for the remainder of the day. A responsible adult should stay with you for 24 hours following the procedure.  For the next 24 hours, DO NOT: -Drive a car -Paediatric nurse -Drink alcoholic beverages -Take any medication unless instructed by  your physician -Make any legal decisions or sign important papers.  Meals: Start with liquid foods such as gelatin or soup. Progress to regular foods as tolerated. Avoid greasy, spicy, heavy foods. If nausea and/or vomiting occur, drink only clear liquids until the nausea and/or vomiting subsides. Call your physician if vomiting continues.  Special Instructions/Symptoms: Your throat may feel dry or sore from the anesthesia or the breathing tube placed in your throat during surgery. If this causes discomfort, gargle with warm salt water. The discomfort should disappear within 24 hours.  If you had a scopolamine patch placed behind your ear for the management of post- operative nausea and/or vomiting:  1. The medication in the patch is effective for 72 hours, after which it should be removed.  Wrap patch in a tissue and discard in the trash. Wash hands thoroughly with soap and water. 2. You may remove the patch earlier than 72 hours if you experience unpleasant side effects which may include dry mouth, dizziness or visual disturbances. 3. Avoid touching the patch. Wash your hands with soap and water after contact with the patch.

## 2015-08-28 ENCOUNTER — Encounter (HOSPITAL_BASED_OUTPATIENT_CLINIC_OR_DEPARTMENT_OTHER): Payer: Self-pay | Admitting: Orthopedic Surgery

## 2015-08-28 MED FILL — oxyCODONE HCL 5 MG TABS: 5 | 3 days supply | Qty: 30 | Fill #0

## 2015-08-28 NOTE — Op Note (Signed)
NAMEGETHSEMANE, HERT                 ACCOUNT NO.:  [id number]  MEDICAL RECORD NO.:  [id number]  LOCATION:                                 FACILITY:  PHYSICIAN:  Wylene Simmer, MD             DATE OF BIRTH:  DATE OF PROCEDURE:  08/27/2015 DATE OF DISCHARGE:                              OPERATIVE REPORT   PREOPERATIVE DIAGNOSIS:  Right ankle lateral malleolus fracture.  POSTOPERATIVE DIAGNOSIS:  Right ankle lateral malleolus fracture.  PROCEDURE PERFORMED: 1. Open reduction and internal fixation of right ankle lateral     malleolus fracture. 2. Stress examination of the right ankle under fluoroscopy. 3. AP mortise and lateral radiographs of the right ankle.  SURGEON:  Wylene Simmer, MD.  ANESTHESIA:  General, regional.  ESTIMATED BLOOD LOSS:  Minimal.  TOURNIQUET TIME:  22 minutes at 250 mmHg.  COMPLICATIONS:  None apparent.  DISPOSITION:  Extubated, awake, and stable to recovery.  INDICATION FOR PROCEDURE:  The patient is a 55 year old woman without significant past medical history.  She fell while hiking last weekend injured her right ankle.  Radiographs showed a displaced lateral malleolus fracture.  She presents today for operative treatment of this displaced and unstable ankle fracture.  She understands the risks and benefits, the alternative treatment options, and elects surgical treatment.  She specifically understands risks of bleeding, infection, nerve damage, blood clots, need for additional surgery, continued pain, nonunion, posttraumatic arthritis, amputation, and death.  PROCEDURE IN DETAIL:  After preoperative consent was obtained and the correct operative site was identified, the patient was brought to the operating room and placed supine on the operating table.  General anesthesia was induced.  Preoperative antibiotics were administered. Surgical time-out was taken.  The right lower extremity was prepped and draped in standard sterile fashion with a tourniquet  around the thigh. The extremity was exsanguinated.  The tourniquet was inflated to 250 mmHg.  A longitudinal incision was made over the lateral malleolus. Sharp dissection was carried down through the skin and subcutaneous tissue.  The fracture site was identified.  It was cleaned of all hematoma and periosteum.  It was irrigated copiously.  Fracture was reduced and held with a tenaculum.  AP and lateral radiographs confirmed appropriate reduction of the fracture.  A 3.5-mm fully-threaded lag screw was inserted from posterior to anterior across the fracture site. A 6 hole one-third tubular plate was then contoured to fit the lateral malleolus and secured distally with 2 correct at 3 unicortical screws and proximally with 3 bicortical screws.  Hardware was all from the [employer] small frag set.  AP mortise and lateral radiographs confirmed appropriate reduction of the fracture and appropriate position length of all hardware.  A stress examination was then performed.  A mortise view was obtained.  Dorsiflexion and external rotation stress was applied with the forefoot held in a supinated position.  No widening of the ankle mortise or medial clear space was identified.  Wound was irrigated copiously.  Vicryl, Monocryl, and nylon were used to close the incision. Sterile dressings were applied followed by well-padded short-leg splint. Tourniquet was released after application of the dressings  at 22 minutes.  The patient was awakened from anesthesia and transported to the recovery room in stable condition.  FOLLOWUP PLAN:  The patient will be nonweightbearing on the right lower extremity for the next 2 weeks.  She will follow up with me in the office for suture removal and conversion to a CAM walker boot to initiate early range of motion and weightbearing.  RADIOGRAPHS:  AP, mortise, and lateral radiographs of the right ankle were obtained intraoperatively.  These show interval reduction  and fixation of the lateral malleolus fracture.  Hardware is appropriately positioned of the appropriate lengths.  No other acute injuries are noted.     Wylene Simmer, MD     JH/MEDQ  D:  08/27/2015  T:  08/28/2015  Job:  [id number]

## 2015-09-08 DIAGNOSIS — S8261XD Displaced fracture of lateral malleolus of right fibula, subsequent encounter for closed fracture with routine healing: Secondary | ICD-10-CM | POA: Diagnosis not present

## 2015-09-11 DIAGNOSIS — S8261XD Displaced fracture of lateral malleolus of right fibula, subsequent encounter for closed fracture with routine healing: Secondary | ICD-10-CM | POA: Diagnosis not present

## 2015-09-16 DIAGNOSIS — I10 Essential (primary) hypertension: Secondary | ICD-10-CM | POA: Diagnosis not present

## 2015-10-09 DIAGNOSIS — S8261XD Displaced fracture of lateral malleolus of right fibula, subsequent encounter for closed fracture with routine healing: Secondary | ICD-10-CM | POA: Diagnosis not present

## 2015-11-09 DIAGNOSIS — S8261XD Displaced fracture of lateral malleolus of right fibula, subsequent encounter for closed fracture with routine healing: Secondary | ICD-10-CM | POA: Diagnosis not present

## 2015-11-11 IMAGING — CR DG ANKLE COMPLETE 3+V*R*
3 series · 3 of 3 positions shown · non-contrast
Comparison: None.

CLINICAL DATA: 52-year-old female status post right ankle injury
with pain posteriorly. Initial encounter.

EXAM:
RIGHT ANKLE - COMPLETE 3+ VIEW

[t ankle joint ap right]
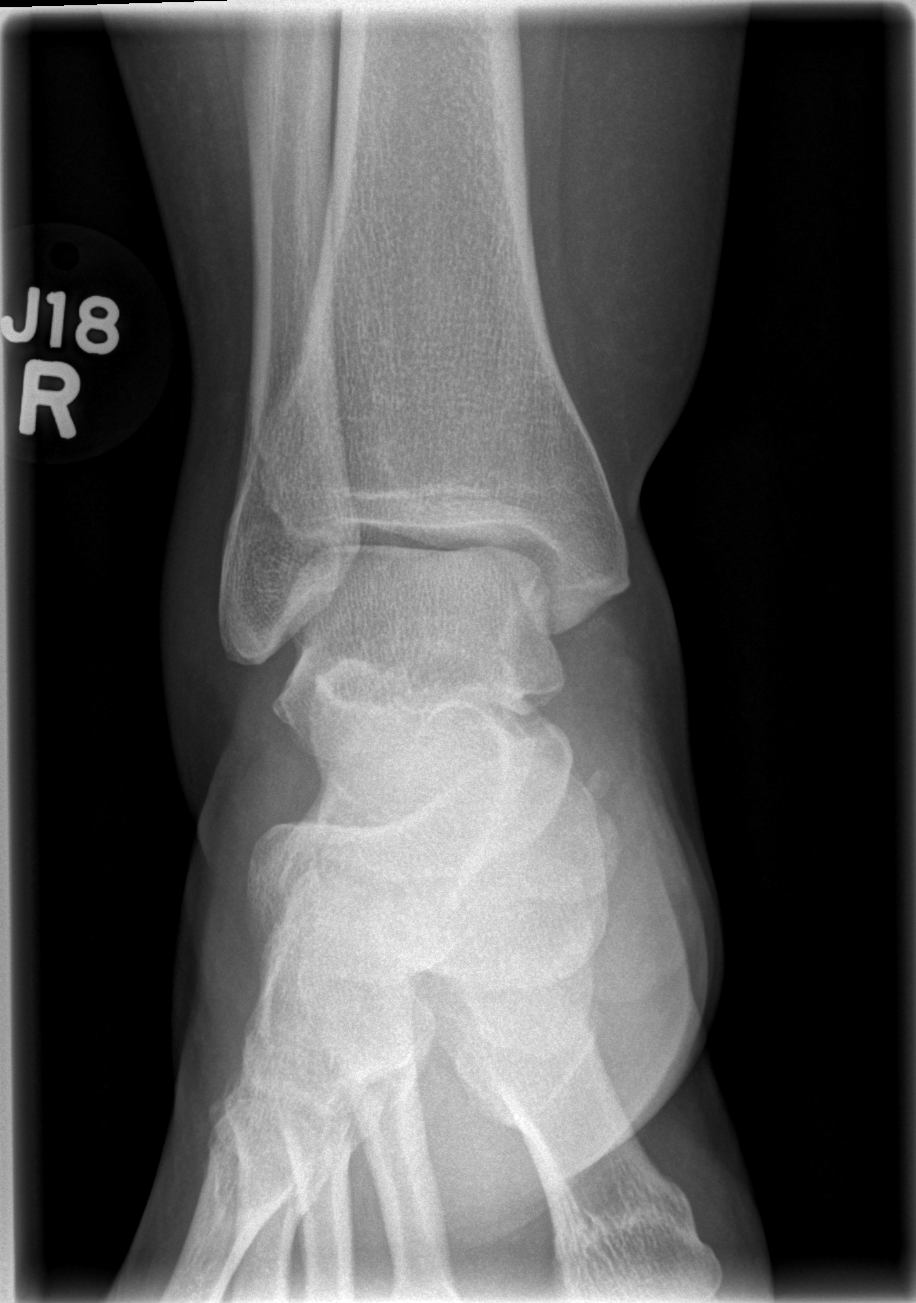

[t ankle joint oblique right]
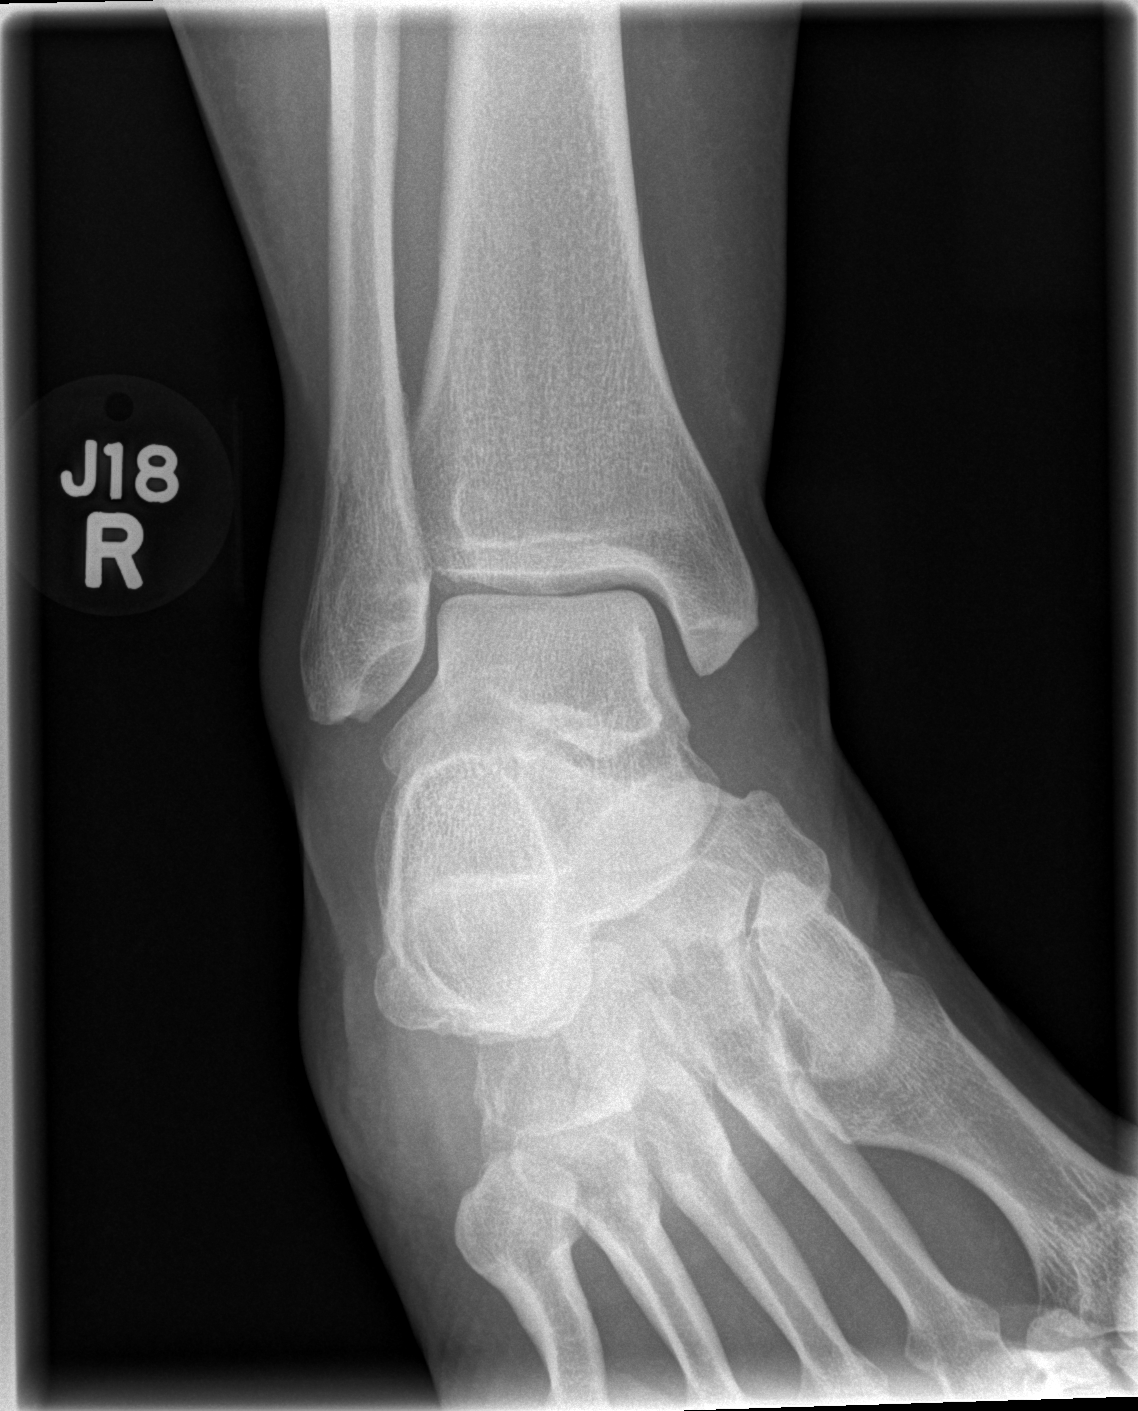

[t ankle joint lat right]
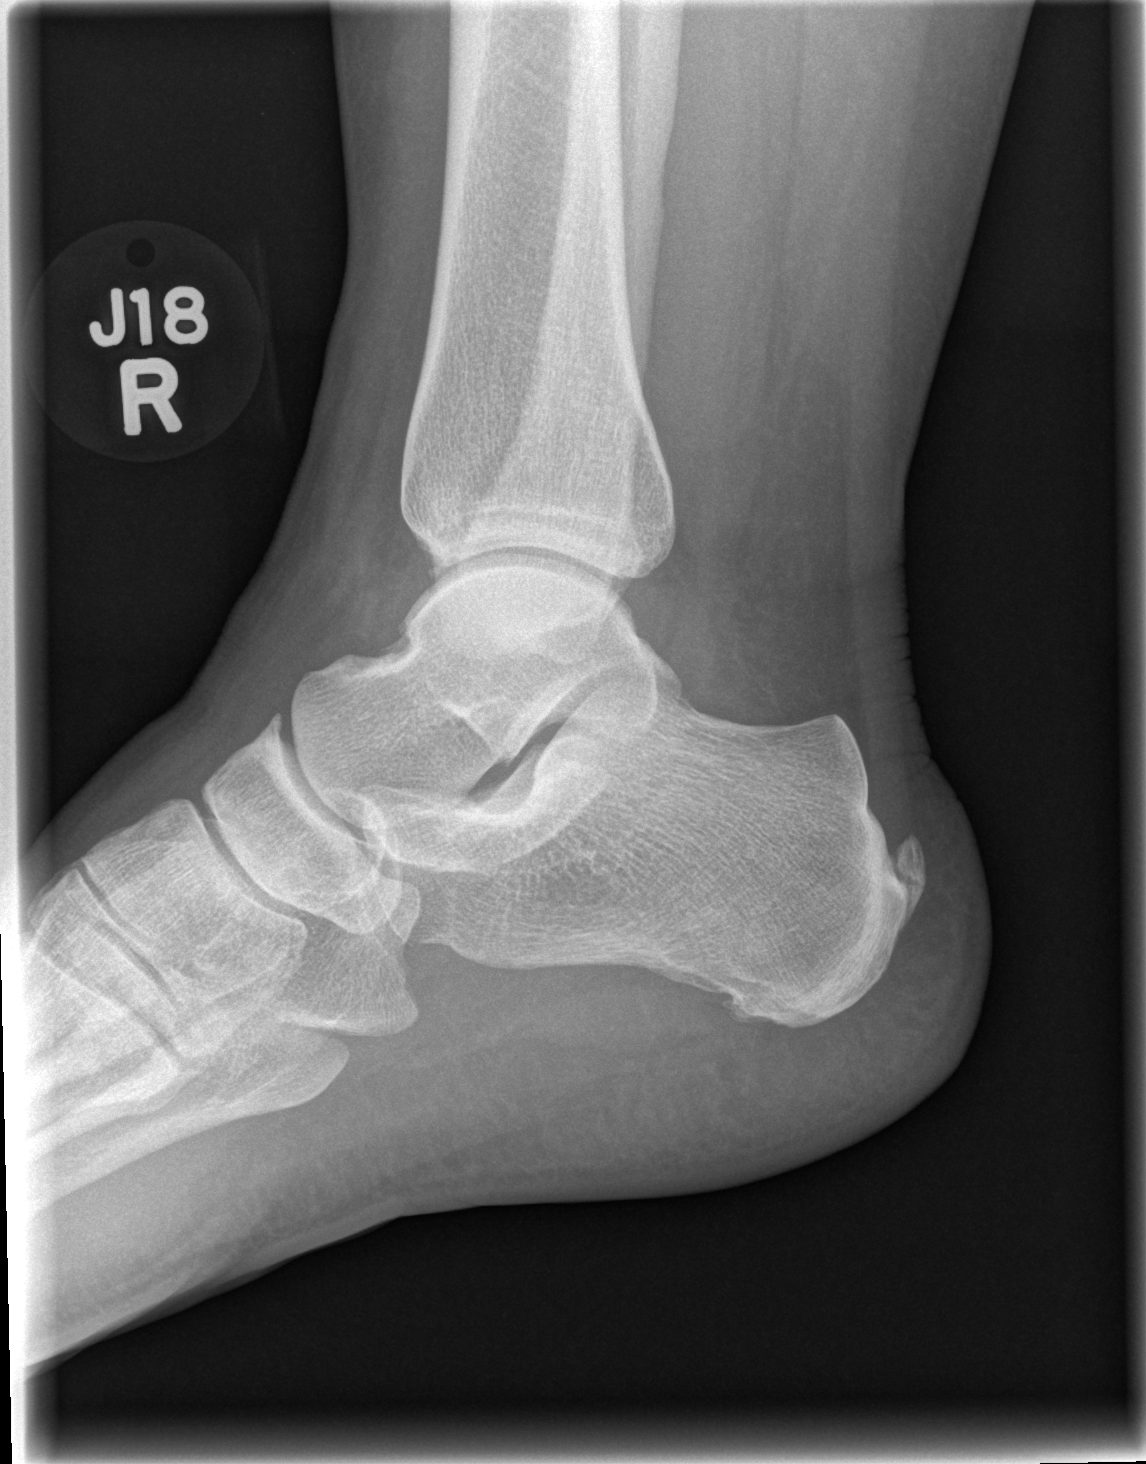

[3 of 3 positions shown; findings below may reference images not displayed]

FINDINGS: Bone mineralization is within normal limits. No joint effusion
identified. Calcaneus appears intact with posterior degenerative
spurring. Mortise joint alignment and tailored dome preserved. No
acute fracture identified.
IMPRESSION: No acute fracture or dislocation identified about the right ankle.

## 2015-11-11 MED FILL — VENLAFAXINE HCL ER 150 MG C: 150 | 90 days supply | Qty: 90 | Fill #0

## 2015-12-17 DIAGNOSIS — I10 Essential (primary) hypertension: Secondary | ICD-10-CM | POA: Diagnosis not present

## 2015-12-18 MED FILL — LOSARTAN POTASSIUM 50 MG TA: 50 | 90 days supply | Qty: 90 | Fill #0

## 2015-12-21 DIAGNOSIS — Z01419 Encounter for gynecological examination (general) (routine) without abnormal findings: Secondary | ICD-10-CM | POA: Diagnosis not present

## 2015-12-21 DIAGNOSIS — Z1231 Encounter for screening mammogram for malignant neoplasm of breast: Secondary | ICD-10-CM | POA: Diagnosis not present

## 2016-02-02 MED FILL — VENLAFAXINE HCL ER 150 MG C: 150 | 90 days supply | Qty: 90 | Fill #1

## 2016-02-12 DIAGNOSIS — Z969 Presence of functional implant, unspecified: Secondary | ICD-10-CM | POA: Diagnosis not present

## 2016-02-12 DIAGNOSIS — M25571 Pain in right ankle and joints of right foot: Secondary | ICD-10-CM | POA: Diagnosis not present

## 2016-02-12 DIAGNOSIS — G8929 Other chronic pain: Secondary | ICD-10-CM | POA: Diagnosis not present

## 2016-02-16 DIAGNOSIS — I10 Essential (primary) hypertension: Secondary | ICD-10-CM | POA: Diagnosis not present

## 2016-02-16 DIAGNOSIS — R109 Unspecified abdominal pain: Secondary | ICD-10-CM | POA: Diagnosis not present

## 2016-02-17 DIAGNOSIS — R109 Unspecified abdominal pain: Secondary | ICD-10-CM | POA: Diagnosis not present

## 2016-03-16 MED FILL — LOSARTAN POTASSIUM 100 MG T: 100 | 60 days supply | Qty: 60 | Fill #0

## 2016-03-16 MED FILL — HYDROCHLOROTHIAZIDE 25 MG T: 25 | 60 days supply | Qty: 60 | Fill #0

## 2016-04-07 ENCOUNTER — Other Ambulatory Visit: Payer: Self-pay | Admitting: Orthopedic Surgery

## 2016-04-12 DIAGNOSIS — I10 Essential (primary) hypertension: Secondary | ICD-10-CM | POA: Diagnosis not present

## 2016-04-12 DIAGNOSIS — E785 Hyperlipidemia, unspecified: Secondary | ICD-10-CM | POA: Diagnosis not present

## 2016-04-28 ENCOUNTER — Encounter (HOSPITAL_BASED_OUTPATIENT_CLINIC_OR_DEPARTMENT_OTHER): Payer: Self-pay | Admitting: *Deleted

## 2016-05-04 NOTE — Pre-Procedure Instructions (Signed)
Patient here for EKG. Machine is down and we will do DOS

## 2016-05-05 ENCOUNTER — Encounter (HOSPITAL_BASED_OUTPATIENT_CLINIC_OR_DEPARTMENT_OTHER)
Admission: RE | Disposition: A | Discharge status: Home / Self Care | Payer: Self-pay | Source: Ambulatory Visit | Attending: Orthopedic Surgery

## 2016-05-05 ENCOUNTER — Encounter (HOSPITAL_BASED_OUTPATIENT_CLINIC_OR_DEPARTMENT_OTHER): Payer: Self-pay

## 2016-05-05 ENCOUNTER — Ambulatory Visit (HOSPITAL_BASED_OUTPATIENT_CLINIC_OR_DEPARTMENT_OTHER): Payer: 59 | Admitting: Certified Registered"

## 2016-05-05 ENCOUNTER — Ambulatory Visit (HOSPITAL_BASED_OUTPATIENT_CLINIC_OR_DEPARTMENT_OTHER)
Admission: RE | Admit: 2016-05-05 | Discharge: 2016-05-05 | Disposition: A | Discharge status: Home / Self Care | Payer: 59 | Source: Ambulatory Visit | Attending: Orthopedic Surgery | Admitting: Orthopedic Surgery

## 2016-05-05 DIAGNOSIS — Z87891 Personal history of nicotine dependence: Secondary | ICD-10-CM | POA: Diagnosis not present

## 2016-05-05 DIAGNOSIS — Z79899 Other long term (current) drug therapy: Secondary | ICD-10-CM | POA: Diagnosis not present

## 2016-05-05 DIAGNOSIS — F329 Major depressive disorder, single episode, unspecified: Secondary | ICD-10-CM | POA: Diagnosis not present

## 2016-05-05 DIAGNOSIS — I10 Essential (primary) hypertension: Secondary | ICD-10-CM | POA: Diagnosis not present

## 2016-05-05 DIAGNOSIS — T8484XA Pain due to internal orthopedic prosthetic devices, implants and grafts, initial encounter: Secondary | ICD-10-CM | POA: Diagnosis not present

## 2016-05-05 DIAGNOSIS — Z6836 Body mass index (BMI) 36.0-36.9, adult: Secondary | ICD-10-CM | POA: Diagnosis not present

## 2016-05-05 DIAGNOSIS — E669 Obesity, unspecified: Secondary | ICD-10-CM | POA: Insufficient documentation

## 2016-05-05 DIAGNOSIS — Y831 Surgical operation with implant of artificial internal device as the cause of abnormal reaction of the patient, or of later complication, without mention of misadventure at the time of the procedure: Secondary | ICD-10-CM | POA: Diagnosis not present

## 2016-05-05 HISTORY — PX: HARDWARE REMOVAL: SHX979

## 2016-05-05 SURGERY — REMOVAL, HARDWARE
Anesthesia: General | Site: Ankle | Laterality: Right

## 2016-05-05 MED ORDER — HYDROMORPHONE HCL 1 MG/ML IJ SOLN
INTRAMUSCULAR | Status: AC
Start: 2016-05-05 — End: 2016-05-05
  Filled 2016-05-05: qty 1

## 2016-05-05 MED ORDER — 0.9 % SODIUM CHLORIDE (POUR BTL) OPTIME
TOPICAL | Status: DC | PRN
  Administered 2016-05-05: 120 mL

## 2016-05-05 MED ORDER — METOCLOPRAMIDE HCL 5 MG/ML IJ SOLN: 10.0000 mg | Freq: Once | INTRAMUSCULAR | Status: DC | PRN

## 2016-05-05 MED ORDER — SENNA 8.6 MG PO TABS: 2.0000 | ORAL_TABLET | Freq: Two times a day (BID) | ORAL | 0 refills | Status: AC

## 2016-05-05 MED ORDER — HYDROCODONE-ACETAMINOPHEN 7.5-325 MG PO TABS
1.0000 | ORAL_TABLET | Freq: Once | ORAL | Status: DC | PRN
Start: 2016-05-05 — End: 2016-05-05

## 2016-05-05 MED ORDER — CEFAZOLIN SODIUM-DEXTROSE 2-4 GM/100ML-% IV SOLN
2.0000 g | INTRAVENOUS | Status: AC
  Administered 2016-05-05: 2 g via INTRAVENOUS

## 2016-05-05 MED ORDER — SCOPOLAMINE 1 MG/3DAYS TD PT72: 1.0000 | MEDICATED_PATCH | Freq: Once | TRANSDERMAL | Status: DC | PRN

## 2016-05-05 MED ORDER — HYDROMORPHONE HCL 1 MG/ML IJ SOLN
0.2500 mg | INTRAMUSCULAR | Status: DC | PRN
  Administered 2016-05-05: 0.5 mg via INTRAVENOUS

## 2016-05-05 MED ORDER — MIDAZOLAM HCL 2 MG/2ML IJ SOLN
1.0000 mg | INTRAMUSCULAR | Status: DC | PRN
  Administered 2016-05-05: 2 mg via INTRAVENOUS

## 2016-05-05 MED ORDER — DOCUSATE SODIUM 100 MG PO CAPS: 100.0000 mg | ORAL_CAPSULE | Freq: Two times a day (BID) | ORAL | 0 refills | Status: AC

## 2016-05-05 MED ORDER — CELECOXIB 200 MG PO CAPS: 200.0000 mg | ORAL_CAPSULE | Freq: Two times a day (BID) | ORAL | 0 refills | Status: AC

## 2016-05-05 MED ORDER — FENTANYL CITRATE (PF) 100 MCG/2ML IJ SOLN
50.0000 ug | INTRAMUSCULAR | Status: DC | PRN
  Administered 2016-05-05 (×2): 50 ug via INTRAVENOUS

## 2016-05-05 MED ORDER — BUPIVACAINE-EPINEPHRINE (PF) 0.5% -1:200000 IJ SOLN
INTRAMUSCULAR | Status: AC
  Filled 2016-05-05: qty 30

## 2016-05-05 MED ORDER — BUPIVACAINE-EPINEPHRINE 0.5% -1:200000 IJ SOLN
INTRAMUSCULAR | Status: DC | PRN
  Administered 2016-05-05: 10 mL

## 2016-05-05 MED ORDER — LACTATED RINGERS IV SOLN
INTRAVENOUS | Status: DC
  Administered 2016-05-05: 08:00:00 via INTRAVENOUS

## 2016-05-05 MED ORDER — ONDANSETRON HCL 4 MG/2ML IJ SOLN
INTRAMUSCULAR | Status: DC | PRN
  Administered 2016-05-05: 4 mg via INTRAVENOUS

## 2016-05-05 MED ORDER — PROPOFOL 10 MG/ML IV BOLUS
INTRAVENOUS | Status: DC | PRN
  Administered 2016-05-05: 150 mg via INTRAVENOUS

## 2016-05-05 MED ORDER — HYDROCODONE-ACETAMINOPHEN 5-325 MG PO TABS
1.0000 | ORAL_TABLET | ORAL | 0 refills | Status: AC | PRN
Start: 2016-05-05 — End: ?

## 2016-05-05 MED ORDER — LIDOCAINE HCL (CARDIAC) 20 MG/ML IV SOLN
INTRAVENOUS | Status: DC | PRN
  Administered 2016-05-05: 60 mg via INTRAVENOUS

## 2016-05-05 MED ORDER — DEXAMETHASONE SODIUM PHOSPHATE 10 MG/ML IJ SOLN
INTRAMUSCULAR | Status: DC | PRN
  Administered 2016-05-05: 10 mg via INTRAVENOUS

## 2016-05-05 MED ORDER — CHLORHEXIDINE GLUCONATE 4 % EX LIQD: 60.0000 mL | Freq: Once | CUTANEOUS | Status: DC

## 2016-05-05 MED ORDER — SODIUM CHLORIDE 0.9 % IV SOLN: INTRAVENOUS | Status: DC

## 2016-05-05 MED FILL — CELECOXIB 200 MG CAPSULE: 200 | 30 days supply | Qty: 60 | Fill #0

## 2016-05-05 MED FILL — HYDROCODON-APAP 5-325: 5-325 | 3 days supply | Qty: 30 | Fill #0

## 2016-05-05 MED FILL — VENLAFAXINE HCL ER 150 MG C: 150 | 90 days supply | Qty: 90 | Fill #0

## 2016-05-05 SURGICAL SUPPLY — 67 items
APL SKNCLS STERI-STRIP NONHPOA (GAUZE/BANDAGES/DRESSINGS)
BANDAGE ACE 4X5 VEL STRL LF (GAUZE/BANDAGES/DRESSINGS) ×2 IMPLANT
BANDAGE ESMARK 6X9 LF (GAUZE/BANDAGES/DRESSINGS) IMPLANT
BENZOIN TINCTURE PRP APPL 2/3 (GAUZE/BANDAGES/DRESSINGS) IMPLANT
BLADE SURG 15 STRL LF DISP TIS (BLADE) ×2 IMPLANT
BLADE SURG 15 STRL SS (BLADE) ×6
BNDG CMPR 9X4 STRL LF SNTH (GAUZE/BANDAGES/DRESSINGS)
BNDG CMPR 9X6 STRL LF SNTH (GAUZE/BANDAGES/DRESSINGS)
BNDG COHESIVE 4X5 TAN STRL (GAUZE/BANDAGES/DRESSINGS) IMPLANT
BNDG COHESIVE 6X5 TAN STRL LF (GAUZE/BANDAGES/DRESSINGS) IMPLANT
BNDG ESMARK 4X9 LF (GAUZE/BANDAGES/DRESSINGS) IMPLANT
BNDG ESMARK 6X9 LF (GAUZE/BANDAGES/DRESSINGS)
CHLORAPREP W/TINT 26ML (MISCELLANEOUS) ×3 IMPLANT
CLOSURE WOUND 1/2 X4 (GAUZE/BANDAGES/DRESSINGS)
COVER BACK TABLE 60X90IN (DRAPES) ×3 IMPLANT
CUFF TOURNIQUET SINGLE 34IN LL (TOURNIQUET CUFF) ×2 IMPLANT
DECANTER SPIKE VIAL GLASS SM (MISCELLANEOUS) IMPLANT
DRAPE EXTREMITY T 121X128X90 (DRAPE) ×3 IMPLANT
DRAPE OEC MINIVIEW 54X84 (DRAPES) ×2 IMPLANT
DRAPE SURG 17X23 STRL (DRAPES) IMPLANT
DRAPE U-SHAPE 47X51 STRL (DRAPES) ×3 IMPLANT
DRSG MEPITEL 4X7.2 (GAUZE/BANDAGES/DRESSINGS) ×3 IMPLANT
DRSG PAD ABDOMINAL 8X10 ST (GAUZE/BANDAGES/DRESSINGS) ×2 IMPLANT
ELECT REM PT RETURN 9FT ADLT (ELECTROSURGICAL) ×3
ELECTRODE REM PT RTRN 9FT ADLT (ELECTROSURGICAL) ×1 IMPLANT
GAUZE SPONGE 4X4 12PLY STRL (GAUZE/BANDAGES/DRESSINGS) ×3 IMPLANT
GLOVE BIO SURGEON STRL SZ8 (GLOVE) ×3 IMPLANT
GLOVE BIOGEL PI IND STRL 7.0 (GLOVE) IMPLANT
GLOVE BIOGEL PI IND STRL 8 (GLOVE) ×2 IMPLANT
GLOVE BIOGEL PI INDICATOR 7.0 (GLOVE) ×4
GLOVE BIOGEL PI INDICATOR 8 (GLOVE) ×4
GLOVE ECLIPSE 6.5 STRL STRAW (GLOVE) ×2 IMPLANT
GLOVE ECLIPSE 8.0 STRL XLNG CF (GLOVE) ×3 IMPLANT
GOWN STRL REUS W/ TWL LRG LVL3 (GOWN DISPOSABLE) ×1 IMPLANT
GOWN STRL REUS W/ TWL XL LVL3 (GOWN DISPOSABLE) ×2 IMPLANT
GOWN STRL REUS W/TWL LRG LVL3 (GOWN DISPOSABLE) ×3
GOWN STRL REUS W/TWL XL LVL3 (GOWN DISPOSABLE) ×6
NEEDLE HYPO 22GX1.5 SAFETY (NEEDLE) ×2 IMPLANT
PACK BASIN DAY SURGERY FS (CUSTOM PROCEDURE TRAY) ×3 IMPLANT
PAD CAST 4YDX4 CTTN HI CHSV (CAST SUPPLIES) ×1 IMPLANT
PADDING CAST ABS 4INX4YD NS (CAST SUPPLIES)
PADDING CAST ABS COTTON 4X4 ST (CAST SUPPLIES) IMPLANT
PADDING CAST COTTON 4X4 STRL (CAST SUPPLIES) ×3
PADDING CAST COTTON 6X4 STRL (CAST SUPPLIES) IMPLANT
PENCIL BUTTON HOLSTER BLD 10FT (ELECTRODE) ×2 IMPLANT
SANITIZER HAND PURELL 535ML FO (MISCELLANEOUS) ×3 IMPLANT
SHEET MEDIUM DRAPE 40X70 STRL (DRAPES) ×3 IMPLANT
SLEEVE SCD COMPRESS KNEE MED (MISCELLANEOUS) ×3 IMPLANT
SPLINT FAST PLASTER 5X30 (CAST SUPPLIES)
SPLINT PLASTER CAST FAST 5X30 (CAST SUPPLIES) IMPLANT
SPONGE LAP 18X18 X RAY DECT (DISPOSABLE) ×3 IMPLANT
STOCKINETTE 6  STRL (DRAPES) ×2
STOCKINETTE 6 STRL (DRAPES) ×1 IMPLANT
STRIP CLOSURE SKIN 1/2X4 (GAUZE/BANDAGES/DRESSINGS) IMPLANT
SUCTION FRAZIER HANDLE 10FR (MISCELLANEOUS) ×2
SUCTION TUBE FRAZIER 10FR DISP (MISCELLANEOUS) IMPLANT
SUT ETHILON 3 0 PS 1 (SUTURE) ×2 IMPLANT
SUT MNCRL AB 3-0 PS2 18 (SUTURE) ×3 IMPLANT
SUT VIC AB 0 SH 27 (SUTURE) IMPLANT
SUT VIC AB 2-0 SH 27 (SUTURE)
SUT VIC AB 2-0 SH 27XBRD (SUTURE) IMPLANT
SYR BULB 3OZ (MISCELLANEOUS) ×3 IMPLANT
SYR CONTROL 10ML LL (SYRINGE) ×2 IMPLANT
TOWEL OR 17X24 6PK STRL BLUE (TOWEL DISPOSABLE) ×3 IMPLANT
TUBE CONNECTING 20'X1/4 (TUBING) ×1
TUBE CONNECTING 20X1/4 (TUBING) ×1 IMPLANT
UNDERPAD 30X30 (UNDERPADS AND DIAPERS) ×3 IMPLANT

## 2016-05-05 NOTE — Anesthesia Preprocedure Evaluation (Addendum)
Anesthesia Evaluation  Patient identified by MRN, date of birth, ID band Patient awake    Reviewed: Allergy & Precautions, NPO status , Patient's Chart, lab work & pertinent test results  Airway Mallampati: II  TM Distance: >3 FB Neck ROM: Full    Dental no notable dental hx. (+) Teeth Intact   Pulmonary former smoker,    Pulmonary exam normal breath sounds clear to auscultation       Cardiovascular hypertension, Pt. on medications Normal cardiovascular exam Rhythm:Regular Rate:Normal     Neuro/Psych PSYCHIATRIC DISORDERS Anxiety Depression negative neurological ROS     GI/Hepatic negative GI ROS, Neg liver ROS,   Endo/Other  Obesity  Renal/GU negative Renal ROS  negative genitourinary   Musculoskeletal Painful hardware right ankle   Abdominal (+) + obese,   Peds  Hematology negative hematology ROS (+)   Anesthesia Other Findings   Reproductive/Obstetrics                            Anesthesia Physical Anesthesia Plan  ASA: II  Anesthesia Plan: General   Post-op Pain Management:    Induction:   Airway Management Planned: LMA  Additional Equipment:   Intra-op Plan:   Post-operative Plan: Extubation in OR  Informed Consent: I have reviewed the patients History and Physical, chart, labs and discussed the procedure including the risks, benefits and alternatives for the proposed anesthesia with the patient or authorized representative who has indicated his/her understanding and acceptance.   Dental advisory given  Plan Discussed with: Anesthesiologist, CRNA and Surgeon  Anesthesia Plan Comments:        Anesthesia Quick Evaluation

## 2016-05-05 NOTE — Discharge Instructions (Addendum)
Wylene Simmer, MD [place]  Please read the following information regarding your care after surgery.  Medications  You only need a prescription for the narcotic pain medicine (ex. oxycodone, Percocet, Norco).  All of the other medicines listed below are available over the counter. X celebrex as prescribed twice daily with food as you need for minor pain X hydrocodone as prescribed for moderate to severe pain    Narcotic pain medicine (ex. oxycodone, Percocet, Vicodin) will cause constipation.  To prevent this problem, take the following medicines while you are taking any pain medicine. X docusate sodium (Colace) 100 mg twice a day X senna (Senokot) 2 tablets twice a day  Weight Bearing X Bear weight when you are able on your operated leg or foot.  You may walk in your regular shoes or use your CAM boot as needed for pain.   Dressing X Keep your splint or cast clean and dry.  Dont put anything (coat hanger, pencil, etc) down inside of it.  If it gets damp, use a hair dryer on the cool setting to dry it.  If it gets soaked, call the office to schedule an appointment for a cast change.     After your dressing, cast or splint is removed; you may shower, but do not soak or scrub the wound.  Allow the water to run over it, and then gently pat it dry.  Swelling It is normal for you to have swelling where you had surgery.  To reduce swelling and pain, keep your toes above your nose for at least 3 days after surgery.  It may be necessary to keep your foot or leg elevated for several weeks.  If it hurts, it should be elevated.  Follow Up Call my office at [phone] when you are discharged from the hospital or surgery center to schedule an appointment to be seen two weeks after surgery.  Call my office at [phone] if you develop a fever >101.5 F, nausea, vomiting, bleeding from the surgical site or severe pain.       Post Anesthesia Home Care  Instructions  Activity: Get plenty of rest for the remainder of the day. A responsible adult should stay with you for 24 hours following the procedure.  For the next 24 hours, DO NOT: -Drive a car -Paediatric nurse -Drink alcoholic beverages -Take any medication unless instructed by your physician -Make any legal decisions or sign important papers.  Meals: Start with liquid foods such as gelatin or soup. Progress to regular foods as tolerated. Avoid greasy, spicy, heavy foods. If nausea and/or vomiting occur, drink only clear liquids until the nausea and/or vomiting subsides. Call your physician if vomiting continues.  Special Instructions/Symptoms: Your throat may feel dry or sore from the anesthesia or the breathing tube placed in your throat during surgery. If this causes discomfort, gargle with warm salt water. The discomfort should disappear within 24 hours.  If you had a scopolamine patch placed behind your ear for the management of post- operative nausea and/or vomiting:  1. The medication in the patch is effective for 72 hours, after which it should be removed.  Wrap patch in a tissue and discard in the trash. Wash hands thoroughly with soap and water. 2. You may remove the patch earlier than 72 hours if you experience unpleasant side effects which may include dry mouth, dizziness or visual disturbances. 3. Avoid touching the patch. Wash your hands with soap and water after contact with the patch.

## 2016-05-05 NOTE — Brief Op Note (Signed)
05/05/2016  9:16 AM  PATIENT:  Kristen King  56 y.o. female  PRE-OPERATIVE DIAGNOSIS:  Right ankle painful hardware  POST-OPERATIVE DIAGNOSIS:  Right ankle painful hardware  Procedure(s):  Removal of deep implants from the right fibula  SURGEON:  Wylene Simmer, MD  ASSISTANT: Mechele Claude, PA-C  ANESTHESIA:   General  EBL:  minimal   TOURNIQUET:   Total Tourniquet Time Documented: Thigh (Right) - 16 minutes Total: Thigh (Right) - 16 minutes  COMPLICATIONS:  None apparent  DISPOSITION:  Extubated, awake and stable to recovery.  DICTATION ID:  [id number]

## 2016-05-05 NOTE — H&P (Signed)
Kristen King is an 56 y.o. female.   Chief Complaint:  Right ankle pain HPI: 56 y/o female with right ankle painful hardware s/p ORIF of a right ankle lateral malleolus fracture last year.  She has healed the fracture and presents today for removal of the deep implants.  Past Medical History:  Diagnosis Date  . Ankle fracture    right  . Anxiety   . Depression   . Hypertension    just started lisinopril 1 wk ago    Past Surgical History:  Procedure Laterality Date  . ABDOMINAL HYSTERECTOMY  2001  . OOPHORECTOMY Bilateral 2006  . ORIF ANKLE FRACTURE Right 08/27/2015   Procedure: OPEN REDUCTION INTERNAL FIXATION (ORIF) RIGHT ANKLE  MALLEOLUS FRACTURE ;  Surgeon: Wylene Simmer, MD;  Location: [facility];  Service: Orthopedics;  Laterality: Right;    History reviewed. No pertinent family history. Social History:  reports that she quit smoking about 27 years ago. Her smoking use included Cigarettes. She has a 15.00 pack-year smoking history. She has never used smokeless tobacco. She reports that she drinks about 2.2 oz of alcohol per week . She reports that she does not use drugs.  Allergies:  Allergies  Allergen Reactions  . Meperidine Hcl Itching  . Morphine Swelling    Swelling of face, eyes, lips itching  . Novocain [Procaine] Swelling    Medications Prior to Admission  Medication Sig Dispense Refill  . Cholecalciferol (VITAMIN D-3) 1000 units CAPS Take by mouth.    . cyanocobalamin 500 MCG tablet Take 500 mcg by mouth daily.    . hydrochlorothiazide (HYDRODIURIL) 25 MG tablet Take 25 mg by mouth daily.    Marland Kitchen losartan (COZAAR) 100 MG tablet Take 100 mg by mouth daily.    . Multiple Vitamin (MULTIVITAMIN WITH MINERALS) TABS tablet Take 1 tablet by mouth daily.    Marland Kitchen venlafaxine XR (EFFEXOR-XR) 150 MG 24 hr capsule Take 150 mg by mouth daily with breakfast.      No results found for this or any previous visit (from the past 48 hour(s)). No results found.  ROS   No recent f/c/n/v/w tloss  Blood pressure (!) 128/58, pulse 66, temperature 97.9 F (36.6 C), temperature source Oral, resp. rate 16, height 5' (1.524 m), weight 85.3 kg (188 lb), SpO2 100 %. Physical Exam  wn wd woman in nad.  A and O x 4.  Mood and affect normal.  EOMI.  resp unlabored.  R ankle with healed surgical incision.  No lympadenopathy.  5/5 strength in PF and DF of the ankel and toes.  Normal sens to LT about the ankle.  Assessment/Plan R ankle painful hardware.  To OR for removal of deep implants from the right ankle.  The risks and benefits of the alternative treatment options have been discussed in detail.  The patient wishes to proceed with surgery and specifically understands risks of bleeding, infection, nerve damage, blood clots, need for additional surgery, amputation and death.   Wylene Simmer, MD May 07, 2016, 8:07 AM

## 2016-05-05 NOTE — Anesthesia Procedure Notes (Signed)
Procedure Name: LMA Insertion Date/Time: 05/05/2016 8:37 AM Performed by: Yannick Steuber D Pre-anesthesia Checklist: Patient identified, Emergency Drugs available, Suction available and Patient being monitored Patient Re-evaluated:Patient Re-evaluated prior to inductionOxygen Delivery Method: Circle system utilized Preoxygenation: Pre-oxygenation with 100% oxygen Intubation Type: IV induction Ventilation: Mask ventilation without difficulty LMA: LMA inserted LMA Size: 3.0 Number of attempts: 1 Airway Equipment and Method: Bite block Placement Confirmation: positive ETCO2 Tube secured with: Tape Dental Injury: Teeth and Oropharynx as per pre-operative assessment

## 2016-05-05 NOTE — Transfer of Care (Signed)
Immediate Anesthesia Transfer of Care Note  Patient: Kristen King  Procedure(s) Performed: Procedure(s) with comments: HARDWARE REMOVAL RIGHT ANKLE (Right) - requests 1hr  Patient Location: PACU  Anesthesia Type:General  Level of Consciousness: awake and patient cooperative  Airway & Oxygen Therapy: Patient Spontanous Breathing and Patient connected to face mask oxygen  Post-op Assessment: Report given to RN and Post -op Vital signs reviewed and stable  Post vital signs: Reviewed and stable  Last Vitals:  Vitals:   05/05/16 0755  BP: (!) 128/58  Pulse: 66  Resp: 16  Temp: 36.6 C    Last Pain:  Vitals:   05/05/16 0755  TempSrc: Oral         Complications: No apparent anesthesia complications

## 2016-05-05 NOTE — Op Note (Signed)
NAMEJAZZ, BIDDY Kristen.:  [id number]  MEDICAL RECORD Kristen.:  [id number]  LOCATION:                                 FACILITY:  PHYSICIAN:  Wylene Simmer, MD             DATE OF BIRTH:  DATE OF PROCEDURE:  05/05/2016 DATE OF DISCHARGE:                              OPERATIVE REPORT   PREOPERATIVE DIAGNOSIS:  Right ankle painful hardware.  POSTOPERATIVE DIAGNOSIS:  Right ankle painful hardware.  PROCEDURE:  Removal of deep implants from the right fibula.  SURGEON:  Wylene Simmer, MD.  ASSISTANT:  Mechele Claude, PA-C.  ANESTHESIA:  General.  ESTIMATED BLOOD LOSS:  Minimal.  TOURNIQUET TIME:  16 minutes at 250 mmHg.  COMPLICATIONS:  None apparent.  DISPOSITION:  Extubated, awake and stable to Recovery.  INDICATIONS FOR PROCEDURE:  The patient is a 56 year old woman who is status post ORIF of her right ankle, fibula fracture last year.  She has healed this fracture and complains of pain at the scar due to her hardware.  She presents today for removal of the deep implants.  She understands the risks and benefits of the alternative treatment options and elects surgical treatment.  She specifically understands risks of bleeding, infection, nerve damage, blood clots, need for additional surgery, continued pain, amputation, and death.  PROCEDURE IN DETAIL:  After preoperative consent was obtained and the correct operative site was identified, the patient was brought to the operating room and placed supine on the operating table.  General anesthesia was induced.  Preoperative antibiotics were administered. Surgical time-out was taken.  The right lower extremity was prepped and draped in standard sterile fashion with tourniquet around the thigh. The extremity was exsanguinated and the tourniquet was inflated to 250 mmHg.  The patient's previous lateral incision was opened again sharply and dissection was carried down through the skin and subcutaneous tissues to the  level of the plate.  The plate was freed of all soft tissues and all of the screw heads were exposed.  All of the screws were then removed in their entirety, and the plate was removed in its entirety as well.  The lag screw was identified and also removed in its entirety.  Wound was then irrigated copiously.  Deep subcutaneous tissues were approximated with Monocryl.  Superficial subcutaneous tissues were closed with inverted simple sutures of 3-0 Monocryl, and the skin incision was closed with a running 3-0 nylon.  Sterile dressings were applied followed by compression wrap. Marcaine 0.5% with epinephrine was infiltrated into the subcutaneous tissues around the skin incision after closure of the wound.  The patient was awakened from anesthesia and transported to the recovery room in stable condition.  FOLLOWUP PLAN:  The patient will be weightbearing as tolerated on the right lower extremity.  She will follow up with me in 2 weeks in the office for suture removal.  She will avoid jumping or running for the next month.  Mechele Claude, PA-C, was present and scrubbed for the duration of the case.  His assistance was essential in positioning the patient, prepping and draping, gaining and maintaining exposure, performing the operation, closing  and dressing the wounds.     Wylene Simmer, MD     JH/MEDQ  D:  05/05/2016  T:  05/05/2016  Job:  [id number]

## 2016-05-05 NOTE — Anesthesia Postprocedure Evaluation (Signed)
Anesthesia Post Note  Patient: Kristen King  Procedure(s) Performed: Procedure(s) (LRB): HARDWARE REMOVAL RIGHT ANKLE (Right)  Patient location during evaluation: PACU Anesthesia Type: General Level of consciousness: awake and alert and oriented Pain management: pain level controlled Vital Signs Assessment: post-procedure vital signs reviewed and stable Respiratory status: spontaneous breathing, nonlabored ventilation and respiratory function stable Cardiovascular status: blood pressure returned to baseline and stable Postop Assessment: no signs of nausea or vomiting Anesthetic complications: no       Last Vitals:  Vitals:   05/05/16 0945 05/05/16 0958  BP: (!) 120/56 (!) 151/68  Pulse: 74 79  Resp: 10 14  Temp:  36.8 C    Last Pain:  Vitals:   05/05/16 0958  TempSrc: Oral  PainSc: 3         RLE Motor Response: Purposeful movement (05/05/16 0957) RLE Sensation: Full sensation (05/05/16 0957)      Buell Parcel A.

## 2016-05-06 MED FILL — HYDROCHLOROTHIAZIDE 25 MG T: 25 | 90 days supply | Qty: 90 | Fill #0

## 2016-05-06 NOTE — Addendum Note (Signed)
Addendum  created 05/06/16 1825 by Josephine Igo, MD   SmartForm saved

## 2016-05-09 ENCOUNTER — Encounter (HOSPITAL_BASED_OUTPATIENT_CLINIC_OR_DEPARTMENT_OTHER): Payer: Self-pay | Admitting: Orthopedic Surgery

## 2016-06-06 MED FILL — LOSARTAN POTASSIUM 100 MG T: 100 | 90 days supply | Qty: 90 | Fill #0

## 2016-08-04 MED FILL — VENLAFAXINE HCL ER 150 MG C: 150 | 90 days supply | Qty: 90 | Fill #1

## 2016-08-10 DIAGNOSIS — E785 Hyperlipidemia, unspecified: Secondary | ICD-10-CM | POA: Diagnosis not present

## 2016-08-10 DIAGNOSIS — F329 Major depressive disorder, single episode, unspecified: Secondary | ICD-10-CM | POA: Diagnosis not present

## 2016-08-10 DIAGNOSIS — I10 Essential (primary) hypertension: Secondary | ICD-10-CM | POA: Diagnosis not present

## 2016-08-12 MED FILL — HYDROCHLOROTHIAZIDE 25 MG T: 25 | 90 days supply | Qty: 90 | Fill #1

## 2016-09-12 MED FILL — LOSARTAN POTASSIUM 100 MG T: 100 | 90 days supply | Qty: 90 | Fill #1

## 2016-11-03 MED FILL — VENLAFAXINE HCL ER 150 MG C: 150 | 90 days supply | Qty: 90 | Fill #0

## 2016-11-09 DIAGNOSIS — M7702 Medial epicondylitis, left elbow: Secondary | ICD-10-CM | POA: Diagnosis not present

## 2016-11-15 MED FILL — HYDROCHLOROTHIAZIDE 25 MG T: 25 | 90 days supply | Qty: 90 | Fill #0

## 2016-11-22 DIAGNOSIS — M25522 Pain in left elbow: Secondary | ICD-10-CM | POA: Diagnosis not present

## 2016-11-22 DIAGNOSIS — M7702 Medial epicondylitis, left elbow: Secondary | ICD-10-CM | POA: Diagnosis not present

## 2016-12-01 DIAGNOSIS — M25522 Pain in left elbow: Secondary | ICD-10-CM | POA: Diagnosis not present

## 2016-12-01 DIAGNOSIS — M7702 Medial epicondylitis, left elbow: Secondary | ICD-10-CM | POA: Diagnosis not present

## 2016-12-02 DIAGNOSIS — M7702 Medial epicondylitis, left elbow: Secondary | ICD-10-CM | POA: Diagnosis not present

## 2016-12-02 DIAGNOSIS — M25522 Pain in left elbow: Secondary | ICD-10-CM | POA: Diagnosis not present

## 2016-12-08 DIAGNOSIS — H5203 Hypermetropia, bilateral: Secondary | ICD-10-CM | POA: Diagnosis not present

## 2016-12-08 DIAGNOSIS — H524 Presbyopia: Secondary | ICD-10-CM | POA: Diagnosis not present

## 2016-12-08 DIAGNOSIS — H52223 Regular astigmatism, bilateral: Secondary | ICD-10-CM | POA: Diagnosis not present

## 2016-12-09 DIAGNOSIS — M25522 Pain in left elbow: Secondary | ICD-10-CM | POA: Diagnosis not present

## 2016-12-09 DIAGNOSIS — M7702 Medial epicondylitis, left elbow: Secondary | ICD-10-CM | POA: Diagnosis not present

## 2016-12-12 DIAGNOSIS — M25522 Pain in left elbow: Secondary | ICD-10-CM | POA: Diagnosis not present

## 2016-12-12 DIAGNOSIS — M7702 Medial epicondylitis, left elbow: Secondary | ICD-10-CM | POA: Diagnosis not present

## 2016-12-15 DIAGNOSIS — M7702 Medial epicondylitis, left elbow: Secondary | ICD-10-CM | POA: Diagnosis not present

## 2016-12-15 DIAGNOSIS — M25522 Pain in left elbow: Secondary | ICD-10-CM | POA: Diagnosis not present

## 2016-12-16 MED FILL — LOSARTAN POTASSIUM 100 MG T: 100 | 90 days supply | Qty: 90 | Fill #0

## 2016-12-19 DIAGNOSIS — M25522 Pain in left elbow: Secondary | ICD-10-CM | POA: Diagnosis not present

## 2016-12-19 DIAGNOSIS — M7702 Medial epicondylitis, left elbow: Secondary | ICD-10-CM | POA: Diagnosis not present

## 2016-12-23 DIAGNOSIS — M7702 Medial epicondylitis, left elbow: Secondary | ICD-10-CM | POA: Diagnosis not present

## 2016-12-23 DIAGNOSIS — M25522 Pain in left elbow: Secondary | ICD-10-CM | POA: Diagnosis not present

## 2016-12-29 DIAGNOSIS — M25522 Pain in left elbow: Secondary | ICD-10-CM | POA: Diagnosis not present

## 2016-12-29 DIAGNOSIS — M7702 Medial epicondylitis, left elbow: Secondary | ICD-10-CM | POA: Diagnosis not present

## 2016-12-30 DIAGNOSIS — M25522 Pain in left elbow: Secondary | ICD-10-CM | POA: Diagnosis not present

## 2016-12-30 DIAGNOSIS — M7702 Medial epicondylitis, left elbow: Secondary | ICD-10-CM | POA: Diagnosis not present

## 2017-01-02 DIAGNOSIS — Z1231 Encounter for screening mammogram for malignant neoplasm of breast: Secondary | ICD-10-CM | POA: Diagnosis not present

## 2017-01-02 DIAGNOSIS — Z01419 Encounter for gynecological examination (general) (routine) without abnormal findings: Secondary | ICD-10-CM | POA: Diagnosis not present

## 2017-01-11 DIAGNOSIS — M7702 Medial epicondylitis, left elbow: Secondary | ICD-10-CM | POA: Diagnosis not present

## 2017-01-11 DIAGNOSIS — M25522 Pain in left elbow: Secondary | ICD-10-CM | POA: Diagnosis not present

## 2017-01-31 MED FILL — VENLAFAXINE HCL ER 150 MG C: 150 | 90 days supply | Qty: 90 | Fill #1

## 2017-02-14 DIAGNOSIS — E785 Hyperlipidemia, unspecified: Secondary | ICD-10-CM | POA: Diagnosis not present

## 2017-02-14 DIAGNOSIS — F411 Generalized anxiety disorder: Secondary | ICD-10-CM | POA: Diagnosis not present

## 2017-02-14 DIAGNOSIS — R7309 Other abnormal glucose: Secondary | ICD-10-CM | POA: Diagnosis not present

## 2017-02-14 DIAGNOSIS — I10 Essential (primary) hypertension: Secondary | ICD-10-CM | POA: Diagnosis not present

## 2017-02-15 MED FILL — HYDROCHLOROTHIAZIDE 25 MG T: 25 | 90 days supply | Qty: 90 | Fill #1

## 2017-03-20 MED FILL — LOSARTAN POTASSIUM 100 MG T: 100 | 90 days supply | Qty: 90 | Fill #1

## 2017-05-01 MED FILL — VENLAFAXINE HCL ER 150 MG C: 150 | 90 days supply | Qty: 90 | Fill #2

## 2017-05-16 MED FILL — HYDROCHLOROTHIAZIDE 25 MG T: 25 | 30 days supply | Qty: 30 | Fill #0

## 2017-06-13 MED FILL — LOSARTAN POTASSIUM 100 MG T: 100 | 90 days supply | Qty: 90 | Fill #0 | Status: TO

## 2017-06-13 MED FILL — HYDROCHLOROTHIAZIDE 25 MG T: 25 | 90 days supply | Qty: 90 | Fill #1

## 2017-08-01 MED FILL — VENLAFAXINE HCL ER 150 MG C: 150 | 90 days supply | Qty: 90 | Fill #3

## 2017-08-28 DIAGNOSIS — E785 Hyperlipidemia, unspecified: Secondary | ICD-10-CM | POA: Diagnosis not present

## 2017-08-28 DIAGNOSIS — I10 Essential (primary) hypertension: Secondary | ICD-10-CM | POA: Diagnosis not present

## 2017-09-14 MED FILL — HYDROCHLOROTHIAZIDE 25 MG T: 25 | 60 days supply | Qty: 60 | Fill #2

## 2017-09-19 MED FILL — LOSARTAN POTASSIUM 100 MG T: 100 | 60 days supply | Qty: 60 | Fill #0

## 2017-10-25 MED FILL — VENLAFAXINE HCL ER 150 MG C: 150 | 90 days supply | Qty: 90 | Fill #4

## 2017-11-15 MED FILL — HYDROCHLOROTHIAZIDE 25 MG T: 25 | 90 days supply | Qty: 90 | Fill #0

## 2017-11-17 MED FILL — LOSARTAN POTASSIUM 100 MG T: 100 | 90 days supply | Qty: 90 | Fill #0

## 2017-12-04 DIAGNOSIS — H5203 Hypermetropia, bilateral: Secondary | ICD-10-CM | POA: Diagnosis not present

## 2017-12-04 DIAGNOSIS — H2511 Age-related nuclear cataract, right eye: Secondary | ICD-10-CM | POA: Diagnosis not present

## 2017-12-04 DIAGNOSIS — H52223 Regular astigmatism, bilateral: Secondary | ICD-10-CM | POA: Diagnosis not present

## 2017-12-04 DIAGNOSIS — H524 Presbyopia: Secondary | ICD-10-CM | POA: Diagnosis not present

## 2018-01-31 MED FILL — VENLAFAXINE HCL ER 150 MG C: 150 | 90 days supply | Qty: 90 | Fill #0

## 2018-02-12 MED FILL — HYDROCHLOROTHIAZIDE 25 MG T: 25 | 60 days supply | Qty: 60 | Fill #1

## 2018-02-22 MED FILL — LOSARTAN POTASSIUM 100 MG T: 100 | 90 days supply | Qty: 90 | Fill #1

## 2018-04-16 MED FILL — HYDROCHLOROTHIAZIDE 25 MG T: 25 | 30 days supply | Qty: 30 | Fill #0

## 2018-04-30 MED FILL — VENLAFAXINE HCL ER 150 MG C: 150 | 90 days supply | Qty: 90 | Fill #0 | Status: TO

## 2018-05-15 MED FILL — HYDROCHLOROTHIAZIDE 25 MG T: 25 | 30 days supply | Qty: 30 | Fill #0 | Status: TO

## 2018-05-31 MED FILL — HYDROCHLOROTHIAZIDE 25 MG T: 25 | 30 days supply | Qty: 30 | Fill #0

## 2018-06-01 DIAGNOSIS — E785 Hyperlipidemia, unspecified: Secondary | ICD-10-CM | POA: Diagnosis not present

## 2018-06-01 DIAGNOSIS — F411 Generalized anxiety disorder: Secondary | ICD-10-CM | POA: Diagnosis not present

## 2018-06-01 DIAGNOSIS — I10 Essential (primary) hypertension: Secondary | ICD-10-CM | POA: Diagnosis not present

## 2018-06-01 MED FILL — LOSARTAN POTASSIUM 100 MG T: 100 | 30 days supply | Qty: 30 | Fill #0

## 2018-06-01 MED FILL — HYDROCHLOROTHIAZIDE 25 MG T: 25 | 30 days supply | Qty: 30 | Fill #0 | Status: TO

## 2018-07-06 MED FILL — LOSARTAN POTASSIUM 100 MG T: 100 | 30 days supply | Qty: 30 | Fill #1 | Status: TO

## 2018-07-11 MED FILL — VENLAFAXINE HCL ER 150 MG C: 150 | 90 days supply | Qty: 90 | Fill #0

## 2018-08-08 MED FILL — HYDROCHLOROTHIAZIDE 25 MG T: 25 | 30 days supply | Qty: 30 | Fill #0

## 2018-08-08 MED FILL — LOSARTAN POTASSIUM 100 MG T: 100 | 30 days supply | Qty: 30 | Fill #0

## 2018-08-17 DIAGNOSIS — L237 Allergic contact dermatitis due to plants, except food: Secondary | ICD-10-CM | POA: Diagnosis not present

## 2018-09-07 MED FILL — LOSARTAN POTASSIUM 100 MG T: 100 | 30 days supply | Qty: 30 | Fill #1

## 2018-09-12 MED FILL — HYDROCHLOROTHIAZIDE 25 MG T: 25 | 30 days supply | Qty: 30 | Fill #1

## 2018-09-13 DIAGNOSIS — F329 Major depressive disorder, single episode, unspecified: Secondary | ICD-10-CM | POA: Diagnosis not present

## 2018-09-13 DIAGNOSIS — E785 Hyperlipidemia, unspecified: Secondary | ICD-10-CM | POA: Diagnosis not present

## 2018-09-13 DIAGNOSIS — Z780 Asymptomatic menopausal state: Secondary | ICD-10-CM | POA: Diagnosis not present

## 2018-09-13 DIAGNOSIS — F411 Generalized anxiety disorder: Secondary | ICD-10-CM | POA: Diagnosis not present

## 2018-09-13 DIAGNOSIS — Z803 Family history of malignant neoplasm of breast: Secondary | ICD-10-CM | POA: Diagnosis not present

## 2018-09-13 DIAGNOSIS — I10 Essential (primary) hypertension: Secondary | ICD-10-CM | POA: Diagnosis not present

## 2018-09-14 DIAGNOSIS — R7303 Prediabetes: Secondary | ICD-10-CM | POA: Diagnosis not present

## 2018-09-14 DIAGNOSIS — E785 Hyperlipidemia, unspecified: Secondary | ICD-10-CM | POA: Diagnosis not present

## 2018-09-14 DIAGNOSIS — I10 Essential (primary) hypertension: Secondary | ICD-10-CM | POA: Diagnosis not present

## 2018-09-14 DIAGNOSIS — R5383 Other fatigue: Secondary | ICD-10-CM | POA: Diagnosis not present

## 2018-09-14 DIAGNOSIS — M2550 Pain in unspecified joint: Secondary | ICD-10-CM | POA: Diagnosis not present

## 2018-10-09 DIAGNOSIS — I788 Other diseases of capillaries: Secondary | ICD-10-CM | POA: Diagnosis not present

## 2018-10-09 DIAGNOSIS — I781 Nevus, non-neoplastic: Secondary | ICD-10-CM | POA: Diagnosis not present

## 2018-10-09 DIAGNOSIS — L814 Other melanin hyperpigmentation: Secondary | ICD-10-CM | POA: Diagnosis not present

## 2018-10-09 DIAGNOSIS — D225 Melanocytic nevi of trunk: Secondary | ICD-10-CM | POA: Diagnosis not present

## 2018-10-09 DIAGNOSIS — L821 Other seborrheic keratosis: Secondary | ICD-10-CM | POA: Diagnosis not present

## 2018-10-09 MED FILL — HYDROCHLOROTHIAZIDE 25 MG T: 25 | 30 days supply | Qty: 30 | Fill #2

## 2018-10-09 MED FILL — LOSARTAN POTASSIUM 100 MG T: 100 | 30 days supply | Qty: 30 | Fill #2

## 2018-10-15 DIAGNOSIS — Z01419 Encounter for gynecological examination (general) (routine) without abnormal findings: Secondary | ICD-10-CM | POA: Diagnosis not present

## 2018-10-15 DIAGNOSIS — Z1231 Encounter for screening mammogram for malignant neoplasm of breast: Secondary | ICD-10-CM | POA: Diagnosis not present

## 2018-10-23 DIAGNOSIS — H25813 Combined forms of age-related cataract, bilateral: Secondary | ICD-10-CM | POA: Diagnosis not present

## 2018-10-23 DIAGNOSIS — H524 Presbyopia: Secondary | ICD-10-CM | POA: Diagnosis not present

## 2018-10-23 DIAGNOSIS — H5203 Hypermetropia, bilateral: Secondary | ICD-10-CM | POA: Diagnosis not present

## 2018-10-29 MED FILL — VENLAFAXINE HCL ER 150 MG C: 150 | 90 days supply | Qty: 90 | Fill #0

## 2018-11-06 DIAGNOSIS — L237 Allergic contact dermatitis due to plants, except food: Secondary | ICD-10-CM | POA: Diagnosis not present

## 2018-11-07 MED FILL — LOSARTAN POTASSIUM 100 MG T: 100 | 30 days supply | Qty: 30 | Fill #3

## 2018-11-07 MED FILL — HYDROCHLOROTHIAZIDE 25 MG T: 25 | 30 days supply | Qty: 30 | Fill #3

## 2018-12-05 MED FILL — LOSARTAN POTASSIUM 100 MG T: 100 | 30 days supply | Qty: 30 | Fill #0

## 2018-12-12 MED FILL — HYDROCHLOROTHIAZIDE 25 MG T: 25 | 30 days supply | Qty: 30 | Fill #4

## 2019-01-11 MED FILL — HYDROCHLOROTHIAZIDE 25 MG T: 25 | 30 days supply | Qty: 30 | Fill #0

## 2019-01-11 MED FILL — LOSARTAN POTASSIUM 100 MG T: 100 | 30 days supply | Qty: 30 | Fill #1

## 2019-01-28 MED FILL — VENLAFAXINE HCL ER 150 MG C: 150 | 90 days supply | Qty: 90 | Fill #0

## 2019-02-12 MED FILL — HYDROCHLOROTHIAZIDE 25 MG T: 25 | 30 days supply | Qty: 30 | Fill #1

## 2019-02-12 MED FILL — LOSARTAN POTASSIUM 100 MG T: 100 | 30 days supply | Qty: 30 | Fill #2

## 2019-03-13 MED FILL — HYDROCHLOROTHIAZIDE 25 MG T: 25 | 30 days supply | Qty: 30 | Fill #2

## 2019-03-13 MED FILL — LOSARTAN POTASSIUM 100 MG T: 100 | 30 days supply | Qty: 30 | Fill #3

## 2019-03-21 DIAGNOSIS — F329 Major depressive disorder, single episode, unspecified: Secondary | ICD-10-CM | POA: Diagnosis not present

## 2019-03-21 DIAGNOSIS — Z780 Asymptomatic menopausal state: Secondary | ICD-10-CM | POA: Diagnosis not present

## 2019-03-21 DIAGNOSIS — F411 Generalized anxiety disorder: Secondary | ICD-10-CM | POA: Diagnosis not present

## 2019-03-21 DIAGNOSIS — M19049 Primary osteoarthritis, unspecified hand: Secondary | ICD-10-CM | POA: Diagnosis not present

## 2019-03-21 DIAGNOSIS — I10 Essential (primary) hypertension: Secondary | ICD-10-CM | POA: Diagnosis not present

## 2019-03-21 DIAGNOSIS — E785 Hyperlipidemia, unspecified: Secondary | ICD-10-CM | POA: Diagnosis not present

## 2019-03-21 MED FILL — CELECOXIB 100 MG CAP: 100 | 30 days supply | Qty: 60 | Fill #0

## 2019-04-10 MED FILL — LOSARTAN POTASSIUM 100 MG T: 100 | 30 days supply | Qty: 30 | Fill #4

## 2019-04-10 MED FILL — HYDROCHLOROTHIAZIDE 25 MG T: 25 | 30 days supply | Qty: 30 | Fill #0

## 2019-04-16 MED FILL — DICLOFENAC SODIUM 1 % GEL: 1 | 30 days supply | Qty: 500 | Fill #0

## 2019-05-01 MED FILL — VENLAFAXINE HCL ER 150 MG C: 150 | 90 days supply | Qty: 90 | Fill #0

## 2019-05-06 MED FILL — HYDROCHLOROTHIAZIDE 25 MG T: 25 | 30 days supply | Qty: 30 | Fill #1

## 2019-05-06 MED FILL — LOSARTAN POTASSIUM 100 MG T: 100 | 30 days supply | Qty: 30 | Fill #0

## 2019-06-11 MED FILL — HYDROCHLOROTHIAZIDE 25 MG T: 25 | 30 days supply | Qty: 30 | Fill #2

## 2019-06-11 MED FILL — LOSARTAN POTASSIUM 100 MG T: 100 | 30 days supply | Qty: 30 | Fill #1

## 2019-07-22 MED FILL — LOSARTAN POTASSIUM 100 MG T: 100 | 30 days supply | Qty: 30 | Fill #2

## 2019-08-02 MED FILL — VENLAFAXINE HCL ER 150 MG C: 150 | 90 days supply | Qty: 90 | Fill #0

## 2019-08-06 DIAGNOSIS — F329 Major depressive disorder, single episode, unspecified: Secondary | ICD-10-CM | POA: Diagnosis not present

## 2019-08-06 DIAGNOSIS — E785 Hyperlipidemia, unspecified: Secondary | ICD-10-CM | POA: Diagnosis not present

## 2019-08-06 DIAGNOSIS — I10 Essential (primary) hypertension: Secondary | ICD-10-CM | POA: Diagnosis not present

## 2019-08-06 DIAGNOSIS — F411 Generalized anxiety disorder: Secondary | ICD-10-CM | POA: Diagnosis not present

## 2019-08-06 DIAGNOSIS — M19049 Primary osteoarthritis, unspecified hand: Secondary | ICD-10-CM | POA: Diagnosis not present

## 2019-08-12 MED FILL — HYDROCHLOROTHIAZIDE 25 MG T: 25 | 30 days supply | Qty: 30 | Fill #1

## 2019-08-22 MED FILL — LOSARTAN POTASSIUM 100 MG T: 100 | 30 days supply | Qty: 30 | Fill #3

## 2019-09-09 MED FILL — HYDROCHLOROTHIAZIDE 25 MG T: 25 | 30 days supply | Qty: 30 | Fill #0

## 2019-09-23 ENCOUNTER — Other Ambulatory Visit (HOSPITAL_COMMUNITY): Payer: Self-pay | Admitting: Physician Assistant

## 2019-09-23 MED FILL — LOSARTAN POTASSIUM 100 MG T: 100 | 30 days supply | Qty: 30 | Fill #0

## 2019-10-01 DIAGNOSIS — F329 Major depressive disorder, single episode, unspecified: Secondary | ICD-10-CM | POA: Diagnosis not present

## 2019-10-01 DIAGNOSIS — E785 Hyperlipidemia, unspecified: Secondary | ICD-10-CM | POA: Diagnosis not present

## 2019-10-01 DIAGNOSIS — Z0000 Encounter for general adult medical examination without abnormal findings: Secondary | ICD-10-CM | POA: Diagnosis not present

## 2019-10-01 DIAGNOSIS — M19049 Primary osteoarthritis, unspecified hand: Secondary | ICD-10-CM | POA: Diagnosis not present

## 2019-10-01 DIAGNOSIS — Z780 Asymptomatic menopausal state: Secondary | ICD-10-CM | POA: Diagnosis not present

## 2019-10-01 DIAGNOSIS — F411 Generalized anxiety disorder: Secondary | ICD-10-CM | POA: Diagnosis not present

## 2019-10-01 DIAGNOSIS — I10 Essential (primary) hypertension: Secondary | ICD-10-CM | POA: Diagnosis not present

## 2019-10-15 MED FILL — HYDROCHLOROTHIAZIDE 25 MG T: 25 | 30 days supply | Qty: 30 | Fill #1

## 2019-10-17 DIAGNOSIS — D225 Melanocytic nevi of trunk: Secondary | ICD-10-CM | POA: Diagnosis not present

## 2019-10-17 DIAGNOSIS — I788 Other diseases of capillaries: Secondary | ICD-10-CM | POA: Diagnosis not present

## 2019-10-17 DIAGNOSIS — L821 Other seborrheic keratosis: Secondary | ICD-10-CM | POA: Diagnosis not present

## 2019-10-17 DIAGNOSIS — L814 Other melanin hyperpigmentation: Secondary | ICD-10-CM | POA: Diagnosis not present

## 2019-10-17 DIAGNOSIS — I781 Nevus, non-neoplastic: Secondary | ICD-10-CM | POA: Diagnosis not present

## 2019-10-17 DIAGNOSIS — L089 Local infection of the skin and subcutaneous tissue, unspecified: Secondary | ICD-10-CM | POA: Diagnosis not present

## 2019-10-17 MED FILL — MUPIROCIN 2% OINTMENT: 2 | 10 days supply | Qty: 22 | Fill #0

## 2019-10-22 MED FILL — DOXYCYCLINE HYCLATE 100 MG: 100 | 20 days supply | Qty: 20 | Fill #0

## 2019-10-28 ENCOUNTER — Other Ambulatory Visit (HOSPITAL_COMMUNITY): Payer: Self-pay | Admitting: Physician Assistant

## 2019-10-28 MED FILL — VENLAFAXINE HCL ER 150 MG C: 150 | 90 days supply | Qty: 90 | Fill #0

## 2019-10-28 MED FILL — LOSARTAN POTASSIUM 100 MG T: 100 | 30 days supply | Qty: 30 | Fill #1

## 2019-10-29 MED FILL — CEPHALEXIN 500 MG CAPSULE: 500 | 10 days supply | Qty: 20 | Fill #0

## 2019-11-12 ENCOUNTER — Other Ambulatory Visit (HOSPITAL_COMMUNITY): Payer: Self-pay | Admitting: Student

## 2019-11-12 MED FILL — HYDROCHLOROTHIAZIDE 25 MG T: 25 | 90 days supply | Qty: 90 | Fill #0

## 2019-11-28 MED FILL — LOSARTAN POTASSIUM 100 MG T: 100 | 30 days supply | Qty: 30 | Fill #2

## 2019-12-30 MED FILL — LOSARTAN POTASSIUM 100 MG T: 100 | 30 days supply | Qty: 30 | Fill #3

## 2020-01-01 DIAGNOSIS — H25812 Combined forms of age-related cataract, left eye: Secondary | ICD-10-CM | POA: Diagnosis not present

## 2020-01-01 DIAGNOSIS — H2511 Age-related nuclear cataract, right eye: Secondary | ICD-10-CM | POA: Diagnosis not present

## 2020-01-09 DIAGNOSIS — H52222 Regular astigmatism, left eye: Secondary | ICD-10-CM | POA: Diagnosis not present

## 2020-01-09 DIAGNOSIS — Z01818 Encounter for other preprocedural examination: Secondary | ICD-10-CM | POA: Diagnosis not present

## 2020-01-09 DIAGNOSIS — H26112 Localized traumatic opacities, left eye: Secondary | ICD-10-CM | POA: Diagnosis not present

## 2020-01-27 ENCOUNTER — Other Ambulatory Visit (HOSPITAL_COMMUNITY): Payer: Self-pay | Admitting: Physician Assistant

## 2020-01-27 DIAGNOSIS — H26112 Localized traumatic opacities, left eye: Secondary | ICD-10-CM | POA: Diagnosis not present

## 2020-01-27 DIAGNOSIS — H2512 Age-related nuclear cataract, left eye: Secondary | ICD-10-CM | POA: Diagnosis not present

## 2020-01-27 MED FILL — LOSARTAN POTASSIUM 100 MG T: 100 | 30 days supply | Qty: 30 | Fill #0

## 2020-01-29 ENCOUNTER — Other Ambulatory Visit (HOSPITAL_COMMUNITY): Payer: Self-pay

## 2020-01-29 MED FILL — OMRON 3 SERIES BP MONITOR D: 1 days supply | Qty: 1 | Fill #0

## 2020-02-03 MED FILL — VENLAFAXINE HCL ER 150 MG C: 150 | 90 days supply | Qty: 90 | Fill #1

## 2020-02-11 DIAGNOSIS — E785 Hyperlipidemia, unspecified: Secondary | ICD-10-CM | POA: Diagnosis not present

## 2020-02-11 DIAGNOSIS — Z79899 Other long term (current) drug therapy: Secondary | ICD-10-CM | POA: Diagnosis not present

## 2020-02-11 DIAGNOSIS — R7303 Prediabetes: Secondary | ICD-10-CM | POA: Diagnosis not present

## 2020-02-11 DIAGNOSIS — I10 Essential (primary) hypertension: Secondary | ICD-10-CM | POA: Diagnosis not present

## 2020-02-11 DIAGNOSIS — R5383 Other fatigue: Secondary | ICD-10-CM | POA: Diagnosis not present

## 2020-02-13 MED FILL — HYDROCHLOROTHIAZIDE 25 MG T: 25 | 60 days supply | Qty: 60 | Fill #1

## 2020-03-02 MED FILL — LOSARTAN POTASSIUM 100 MG T: 100 | 30 days supply | Qty: 30 | Fill #1

## 2020-03-26 DIAGNOSIS — Z1231 Encounter for screening mammogram for malignant neoplasm of breast: Secondary | ICD-10-CM | POA: Diagnosis not present

## 2020-03-26 DIAGNOSIS — Z01419 Encounter for gynecological examination (general) (routine) without abnormal findings: Secondary | ICD-10-CM | POA: Diagnosis not present

## 2020-03-26 DIAGNOSIS — Z1272 Encounter for screening for malignant neoplasm of vagina: Secondary | ICD-10-CM | POA: Diagnosis not present

## 2020-03-26 DIAGNOSIS — Z90710 Acquired absence of both cervix and uterus: Secondary | ICD-10-CM | POA: Diagnosis not present

## 2020-03-27 DIAGNOSIS — Z124 Encounter for screening for malignant neoplasm of cervix: Secondary | ICD-10-CM | POA: Diagnosis not present

## 2020-04-02 DIAGNOSIS — Z1211 Encounter for screening for malignant neoplasm of colon: Secondary | ICD-10-CM | POA: Diagnosis not present

## 2020-04-02 DIAGNOSIS — F329 Major depressive disorder, single episode, unspecified: Secondary | ICD-10-CM | POA: Diagnosis not present

## 2020-04-02 DIAGNOSIS — Z780 Asymptomatic menopausal state: Secondary | ICD-10-CM | POA: Diagnosis not present

## 2020-04-02 DIAGNOSIS — F411 Generalized anxiety disorder: Secondary | ICD-10-CM | POA: Diagnosis not present

## 2020-04-02 DIAGNOSIS — I10 Essential (primary) hypertension: Secondary | ICD-10-CM | POA: Diagnosis not present

## 2020-04-02 DIAGNOSIS — E785 Hyperlipidemia, unspecified: Secondary | ICD-10-CM | POA: Diagnosis not present

## 2020-04-02 DIAGNOSIS — M19049 Primary osteoarthritis, unspecified hand: Secondary | ICD-10-CM | POA: Diagnosis not present

## 2020-04-08 MED FILL — LOSARTAN POTASSIUM 100 MG T: 100 | 30 days supply | Qty: 30 | Fill #2

## 2020-04-14 ENCOUNTER — Other Ambulatory Visit (HOSPITAL_COMMUNITY): Payer: Self-pay | Admitting: Physician Assistant

## 2020-04-14 MED FILL — HYDROCHLOROTHIAZIDE 25 MG T: 25 | 90 days supply | Qty: 90 | Fill #0

## 2020-04-24 DIAGNOSIS — J029 Acute pharyngitis, unspecified: Secondary | ICD-10-CM | POA: Diagnosis not present

## 2020-04-24 DIAGNOSIS — R059 Cough, unspecified: Secondary | ICD-10-CM | POA: Diagnosis not present

## 2020-04-28 ENCOUNTER — Other Ambulatory Visit (HOSPITAL_COMMUNITY): Payer: Self-pay | Admitting: Physician Assistant

## 2020-04-28 MED FILL — VENLAFAXINE HCL ER 150 MG C: 150 | 90 days supply | Qty: 90 | Fill #0

## 2020-05-07 DIAGNOSIS — K921 Melena: Secondary | ICD-10-CM | POA: Diagnosis not present

## 2020-05-07 DIAGNOSIS — I10 Essential (primary) hypertension: Secondary | ICD-10-CM | POA: Diagnosis not present

## 2020-05-08 ENCOUNTER — Other Ambulatory Visit (HOSPITAL_BASED_OUTPATIENT_CLINIC_OR_DEPARTMENT_OTHER): Payer: Self-pay

## 2020-05-08 DIAGNOSIS — K922 Gastrointestinal hemorrhage, unspecified: Secondary | ICD-10-CM | POA: Diagnosis not present

## 2020-05-08 DIAGNOSIS — R197 Diarrhea, unspecified: Secondary | ICD-10-CM | POA: Diagnosis not present

## 2020-05-08 DIAGNOSIS — R1084 Generalized abdominal pain: Secondary | ICD-10-CM | POA: Diagnosis not present

## 2020-05-08 MED ORDER — DICYCLOMINE HCL 10 MG PO CAPS
20.0000 mg | ORAL_CAPSULE | Freq: Three times a day (TID) | ORAL | 2 refills | Status: AC
  Filled 2020-05-08: qty 90, 15d supply, fill #0

## 2020-05-08 MED FILL — LOSARTAN POTASSIUM 100 MG T: 100 | 30 days supply | Qty: 30 | Fill #3

## 2020-05-11 ENCOUNTER — Other Ambulatory Visit (HOSPITAL_BASED_OUTPATIENT_CLINIC_OR_DEPARTMENT_OTHER): Payer: Self-pay

## 2020-06-02 DIAGNOSIS — K921 Melena: Secondary | ICD-10-CM | POA: Diagnosis not present

## 2020-06-15 ENCOUNTER — Other Ambulatory Visit (HOSPITAL_COMMUNITY): Payer: Self-pay

## 2020-06-15 MED ORDER — LOSARTAN POTASSIUM 100 MG PO TABS
100.0000 mg | ORAL_TABLET | Freq: Every day | ORAL | 5 refills | Status: DC
  Filled 2020-06-15: qty 30, 30d supply, fill #0
  Filled 2020-07-16: qty 30, 30d supply, fill #1
  Filled 2020-08-18: qty 30, 30d supply, fill #2
  Filled 2020-09-28: qty 90, 90d supply, fill #3
  Filled 2020-09-28: qty 30, 30d supply, fill #3

## 2020-06-16 ENCOUNTER — Other Ambulatory Visit (HOSPITAL_COMMUNITY): Payer: Self-pay

## 2020-07-14 MED FILL — Hydrochlorothiazide Tab 25 MG: ORAL | 60 days supply | Qty: 60 | Fill #0 | Status: AC

## 2020-07-15 ENCOUNTER — Other Ambulatory Visit (HOSPITAL_COMMUNITY): Payer: Self-pay

## 2020-07-17 ENCOUNTER — Other Ambulatory Visit (HOSPITAL_COMMUNITY): Payer: Self-pay

## 2020-07-20 ENCOUNTER — Other Ambulatory Visit (HOSPITAL_COMMUNITY): Payer: Self-pay

## 2020-08-05 ENCOUNTER — Other Ambulatory Visit (HOSPITAL_COMMUNITY): Payer: Self-pay

## 2020-08-05 MED ORDER — CARESTART COVID-19 HOME TEST VI KIT
PACK | 0 refills | Status: AC
  Filled 2020-08-05: qty 4, 4d supply, fill #0

## 2020-08-05 MED FILL — Venlafaxine HCl Cap ER 24HR 150 MG (Base Equivalent): ORAL | 90 days supply | Qty: 90 | Fill #0 | Status: AC

## 2020-08-06 ENCOUNTER — Other Ambulatory Visit (HOSPITAL_COMMUNITY): Payer: Self-pay

## 2020-08-07 ENCOUNTER — Other Ambulatory Visit (HOSPITAL_COMMUNITY): Payer: Self-pay

## 2020-08-10 ENCOUNTER — Other Ambulatory Visit (HOSPITAL_COMMUNITY): Payer: Self-pay

## 2020-08-11 DIAGNOSIS — R1084 Generalized abdominal pain: Secondary | ICD-10-CM | POA: Diagnosis not present

## 2020-08-11 DIAGNOSIS — R197 Diarrhea, unspecified: Secondary | ICD-10-CM | POA: Diagnosis not present

## 2020-08-15 DIAGNOSIS — T781XXA Other adverse food reactions, not elsewhere classified, initial encounter: Secondary | ICD-10-CM | POA: Diagnosis not present

## 2020-08-15 DIAGNOSIS — R1310 Dysphagia, unspecified: Secondary | ICD-10-CM | POA: Diagnosis not present

## 2020-08-15 DIAGNOSIS — T7840XA Allergy, unspecified, initial encounter: Secondary | ICD-10-CM | POA: Diagnosis not present

## 2020-08-18 ENCOUNTER — Other Ambulatory Visit (HOSPITAL_COMMUNITY): Payer: Self-pay

## 2020-08-19 ENCOUNTER — Other Ambulatory Visit (HOSPITAL_COMMUNITY): Payer: Self-pay

## 2020-08-20 DIAGNOSIS — T782XXA Anaphylactic shock, unspecified, initial encounter: Secondary | ICD-10-CM | POA: Diagnosis not present

## 2020-09-14 DIAGNOSIS — Z0000 Encounter for general adult medical examination without abnormal findings: Secondary | ICD-10-CM | POA: Diagnosis not present

## 2020-09-14 DIAGNOSIS — I10 Essential (primary) hypertension: Secondary | ICD-10-CM | POA: Diagnosis not present

## 2020-09-14 DIAGNOSIS — Z780 Asymptomatic menopausal state: Secondary | ICD-10-CM | POA: Diagnosis not present

## 2020-09-14 DIAGNOSIS — E785 Hyperlipidemia, unspecified: Secondary | ICD-10-CM | POA: Diagnosis not present

## 2020-09-14 DIAGNOSIS — F329 Major depressive disorder, single episode, unspecified: Secondary | ICD-10-CM | POA: Diagnosis not present

## 2020-09-14 DIAGNOSIS — F411 Generalized anxiety disorder: Secondary | ICD-10-CM | POA: Diagnosis not present

## 2020-09-15 MED FILL — Hydrochlorothiazide Tab 25 MG: ORAL | 60 days supply | Qty: 60 | Fill #1 | Status: AC

## 2020-09-16 ENCOUNTER — Other Ambulatory Visit (HOSPITAL_COMMUNITY): Payer: Self-pay

## 2020-09-17 ENCOUNTER — Other Ambulatory Visit (HOSPITAL_COMMUNITY): Payer: Self-pay

## 2020-09-23 ENCOUNTER — Other Ambulatory Visit (HOSPITAL_COMMUNITY): Payer: Self-pay

## 2020-09-23 DIAGNOSIS — Z87892 Personal history of anaphylaxis: Secondary | ICD-10-CM | POA: Diagnosis not present

## 2020-09-23 DIAGNOSIS — Z91018 Allergy to other foods: Secondary | ICD-10-CM | POA: Diagnosis not present

## 2020-09-23 DIAGNOSIS — L500 Allergic urticaria: Secondary | ICD-10-CM | POA: Diagnosis not present

## 2020-09-23 DIAGNOSIS — Z7689 Persons encountering health services in other specified circumstances: Secondary | ICD-10-CM | POA: Diagnosis not present

## 2020-09-23 MED ORDER — EPINEPHRINE 0.3 MG/0.3ML IJ SOAJ
0.3000 mg | INTRAMUSCULAR | 1 refills | Status: AC | PRN
  Filled 2020-09-23: qty 2, 14d supply, fill #0

## 2020-09-24 DIAGNOSIS — I10 Essential (primary) hypertension: Secondary | ICD-10-CM | POA: Diagnosis not present

## 2020-09-24 DIAGNOSIS — E785 Hyperlipidemia, unspecified: Secondary | ICD-10-CM | POA: Diagnosis not present

## 2020-09-28 ENCOUNTER — Other Ambulatory Visit (HOSPITAL_COMMUNITY): Payer: Self-pay

## 2020-09-29 ENCOUNTER — Other Ambulatory Visit (HOSPITAL_COMMUNITY): Payer: Self-pay

## 2020-10-01 ENCOUNTER — Other Ambulatory Visit (HOSPITAL_COMMUNITY): Payer: Self-pay

## 2020-10-22 DIAGNOSIS — L859 Epidermal thickening, unspecified: Secondary | ICD-10-CM | POA: Diagnosis not present

## 2020-10-22 DIAGNOSIS — I788 Other diseases of capillaries: Secondary | ICD-10-CM | POA: Diagnosis not present

## 2020-10-22 DIAGNOSIS — L814 Other melanin hyperpigmentation: Secondary | ICD-10-CM | POA: Diagnosis not present

## 2020-10-22 DIAGNOSIS — I781 Nevus, non-neoplastic: Secondary | ICD-10-CM | POA: Diagnosis not present

## 2020-10-22 DIAGNOSIS — L821 Other seborrheic keratosis: Secondary | ICD-10-CM | POA: Diagnosis not present

## 2020-10-22 DIAGNOSIS — D225 Melanocytic nevi of trunk: Secondary | ICD-10-CM | POA: Diagnosis not present

## 2020-11-02 ENCOUNTER — Other Ambulatory Visit (HOSPITAL_COMMUNITY): Payer: Self-pay

## 2020-11-03 ENCOUNTER — Other Ambulatory Visit (HOSPITAL_COMMUNITY): Payer: Self-pay

## 2020-11-03 MED ORDER — VENLAFAXINE HCL ER 150 MG PO CP24
150.0000 mg | ORAL_CAPSULE | Freq: Every day | ORAL | 1 refills | Status: DC
  Filled 2020-11-03: qty 90, 90d supply, fill #0
  Filled 2021-02-02: qty 90, 90d supply, fill #1

## 2020-11-12 ENCOUNTER — Other Ambulatory Visit (HOSPITAL_COMMUNITY): Payer: Self-pay

## 2020-11-12 MED FILL — Hydrochlorothiazide Tab 25 MG: ORAL | 60 days supply | Qty: 60 | Fill #2 | Status: AC

## 2020-12-26 ENCOUNTER — Other Ambulatory Visit (HOSPITAL_COMMUNITY): Payer: Self-pay

## 2020-12-29 ENCOUNTER — Other Ambulatory Visit (HOSPITAL_COMMUNITY): Payer: Self-pay

## 2020-12-29 MED ORDER — LOSARTAN POTASSIUM 100 MG PO TABS
100.0000 mg | ORAL_TABLET | Freq: Every day | ORAL | 5 refills | Status: DC
  Filled 2020-12-29: qty 30, 30d supply, fill #0
  Filled 2021-01-06: qty 90, 90d supply, fill #0
  Filled 2021-04-13: qty 90, 90d supply, fill #1

## 2021-01-06 ENCOUNTER — Other Ambulatory Visit (HOSPITAL_COMMUNITY): Payer: Self-pay

## 2021-01-14 ENCOUNTER — Other Ambulatory Visit (HOSPITAL_COMMUNITY): Payer: Self-pay

## 2021-01-14 MED FILL — Hydrochlorothiazide Tab 25 MG: ORAL | 60 days supply | Qty: 60 | Fill #3 | Status: CN

## 2021-01-15 ENCOUNTER — Other Ambulatory Visit (HOSPITAL_COMMUNITY): Payer: Self-pay

## 2021-01-15 MED ORDER — HYDROCHLOROTHIAZIDE 25 MG PO TABS
25.0000 mg | ORAL_TABLET | Freq: Every day | ORAL | 1 refills | Status: DC
  Filled 2021-01-15: qty 60, 60d supply, fill #0
  Filled 2021-03-17: qty 60, 60d supply, fill #1

## 2021-01-22 DIAGNOSIS — R059 Cough, unspecified: Secondary | ICD-10-CM | POA: Diagnosis not present

## 2021-01-22 DIAGNOSIS — J020 Streptococcal pharyngitis: Secondary | ICD-10-CM | POA: Diagnosis not present

## 2021-01-22 DIAGNOSIS — G44309 Post-traumatic headache, unspecified, not intractable: Secondary | ICD-10-CM | POA: Diagnosis not present

## 2021-01-22 DIAGNOSIS — S0990XS Unspecified injury of head, sequela: Secondary | ICD-10-CM | POA: Diagnosis not present

## 2021-01-22 DIAGNOSIS — R52 Pain, unspecified: Secondary | ICD-10-CM | POA: Diagnosis not present

## 2021-01-22 DIAGNOSIS — J029 Acute pharyngitis, unspecified: Secondary | ICD-10-CM | POA: Diagnosis not present

## 2021-02-02 ENCOUNTER — Other Ambulatory Visit (HOSPITAL_COMMUNITY): Payer: Self-pay

## 2021-02-10 DIAGNOSIS — X58XXXA Exposure to other specified factors, initial encounter: Secondary | ICD-10-CM | POA: Diagnosis not present

## 2021-02-10 DIAGNOSIS — R490 Dysphonia: Secondary | ICD-10-CM | POA: Diagnosis not present

## 2021-02-10 DIAGNOSIS — Z91018 Allergy to other foods: Secondary | ICD-10-CM | POA: Diagnosis not present

## 2021-02-10 DIAGNOSIS — T7840XA Allergy, unspecified, initial encounter: Secondary | ICD-10-CM | POA: Diagnosis not present

## 2021-02-10 DIAGNOSIS — R220 Localized swelling, mass and lump, head: Secondary | ICD-10-CM | POA: Diagnosis not present

## 2021-02-10 DIAGNOSIS — L299 Pruritus, unspecified: Secondary | ICD-10-CM | POA: Diagnosis not present

## 2021-03-11 DIAGNOSIS — M19071 Primary osteoarthritis, right ankle and foot: Secondary | ICD-10-CM | POA: Diagnosis not present

## 2021-03-11 DIAGNOSIS — M79674 Pain in right toe(s): Secondary | ICD-10-CM | POA: Diagnosis not present

## 2021-03-17 ENCOUNTER — Other Ambulatory Visit (HOSPITAL_COMMUNITY): Payer: Self-pay

## 2021-04-14 ENCOUNTER — Other Ambulatory Visit (HOSPITAL_COMMUNITY): Payer: Self-pay

## 2021-04-20 DIAGNOSIS — Z90710 Acquired absence of both cervix and uterus: Secondary | ICD-10-CM | POA: Diagnosis not present

## 2021-04-20 DIAGNOSIS — Z1272 Encounter for screening for malignant neoplasm of vagina: Secondary | ICD-10-CM | POA: Diagnosis not present

## 2021-04-20 DIAGNOSIS — Z01419 Encounter for gynecological examination (general) (routine) without abnormal findings: Secondary | ICD-10-CM | POA: Diagnosis not present

## 2021-04-20 DIAGNOSIS — Z1231 Encounter for screening mammogram for malignant neoplasm of breast: Secondary | ICD-10-CM | POA: Diagnosis not present

## 2021-04-27 ENCOUNTER — Other Ambulatory Visit (HOSPITAL_COMMUNITY): Payer: Self-pay

## 2021-04-27 MED ORDER — VENLAFAXINE HCL ER 150 MG PO CP24
150.0000 mg | ORAL_CAPSULE | Freq: Every day | ORAL | 1 refills | Status: DC
  Filled 2021-04-27: qty 90, 90d supply, fill #0
  Filled 2021-07-30: qty 90, 90d supply, fill #1

## 2021-05-10 ENCOUNTER — Other Ambulatory Visit (HOSPITAL_COMMUNITY): Payer: Self-pay

## 2021-05-10 MED ORDER — HYDROCHLOROTHIAZIDE 25 MG PO TABS
25.0000 mg | ORAL_TABLET | Freq: Every morning | ORAL | 1 refills | Status: DC
  Filled 2021-05-10: qty 60, 60d supply, fill #0
  Filled 2021-07-08: qty 60, 60d supply, fill #1

## 2021-05-17 DIAGNOSIS — I10 Essential (primary) hypertension: Secondary | ICD-10-CM | POA: Diagnosis not present

## 2021-05-17 DIAGNOSIS — E785 Hyperlipidemia, unspecified: Secondary | ICD-10-CM | POA: Diagnosis not present

## 2021-05-18 DIAGNOSIS — J305 Allergic rhinitis due to food: Secondary | ICD-10-CM | POA: Diagnosis not present

## 2021-05-18 DIAGNOSIS — R220 Localized swelling, mass and lump, head: Secondary | ICD-10-CM | POA: Diagnosis not present

## 2021-05-18 DIAGNOSIS — T7804XA Anaphylactic reaction due to fruits and vegetables, initial encounter: Secondary | ICD-10-CM | POA: Diagnosis not present

## 2021-05-25 DIAGNOSIS — Z91018 Allergy to other foods: Secondary | ICD-10-CM | POA: Diagnosis not present

## 2021-05-25 DIAGNOSIS — Z87892 Personal history of anaphylaxis: Secondary | ICD-10-CM | POA: Diagnosis not present

## 2021-05-27 ENCOUNTER — Other Ambulatory Visit (HOSPITAL_COMMUNITY): Payer: Self-pay

## 2021-05-27 MED ORDER — CARESTART COVID-19 HOME TEST VI KIT
PACK | 0 refills | Status: AC
  Filled 2021-05-27: qty 4, 8d supply, fill #0

## 2021-06-08 ENCOUNTER — Other Ambulatory Visit (HOSPITAL_COMMUNITY): Payer: Self-pay

## 2021-06-29 ENCOUNTER — Other Ambulatory Visit (HOSPITAL_COMMUNITY): Payer: Self-pay

## 2021-07-08 ENCOUNTER — Other Ambulatory Visit (HOSPITAL_COMMUNITY): Payer: Self-pay

## 2021-07-08 MED ORDER — EPINEPHRINE 0.3 MG/0.3ML IJ SOAJ
INTRAMUSCULAR | 0 refills | Status: AC
  Filled 2021-07-08: qty 2, 14d supply, fill #0

## 2021-07-08 MED ORDER — LOSARTAN POTASSIUM 100 MG PO TABS
100.0000 mg | ORAL_TABLET | Freq: Every day | ORAL | 5 refills | Status: DC
  Filled 2021-07-08: qty 90, 90d supply, fill #0
  Filled 2021-10-07 – 2021-10-11 (×2): qty 90, 90d supply, fill #1

## 2021-07-13 DIAGNOSIS — H5203 Hypermetropia, bilateral: Secondary | ICD-10-CM | POA: Diagnosis not present

## 2021-07-13 DIAGNOSIS — H2511 Age-related nuclear cataract, right eye: Secondary | ICD-10-CM | POA: Diagnosis not present

## 2021-07-13 DIAGNOSIS — H524 Presbyopia: Secondary | ICD-10-CM | POA: Diagnosis not present

## 2021-07-13 DIAGNOSIS — Z961 Presence of intraocular lens: Secondary | ICD-10-CM | POA: Diagnosis not present

## 2021-07-30 ENCOUNTER — Other Ambulatory Visit (HOSPITAL_COMMUNITY): Payer: Self-pay

## 2021-09-06 DIAGNOSIS — F329 Major depressive disorder, single episode, unspecified: Secondary | ICD-10-CM | POA: Diagnosis not present

## 2021-09-06 DIAGNOSIS — Z780 Asymptomatic menopausal state: Secondary | ICD-10-CM | POA: Diagnosis not present

## 2021-09-06 DIAGNOSIS — Z0000 Encounter for general adult medical examination without abnormal findings: Secondary | ICD-10-CM | POA: Diagnosis not present

## 2021-09-06 DIAGNOSIS — F411 Generalized anxiety disorder: Secondary | ICD-10-CM | POA: Diagnosis not present

## 2021-09-06 DIAGNOSIS — I10 Essential (primary) hypertension: Secondary | ICD-10-CM | POA: Diagnosis not present

## 2021-09-06 DIAGNOSIS — E785 Hyperlipidemia, unspecified: Secondary | ICD-10-CM | POA: Diagnosis not present

## 2021-09-14 ENCOUNTER — Other Ambulatory Visit (HOSPITAL_COMMUNITY): Payer: Self-pay

## 2021-09-14 MED ORDER — HYDROCHLOROTHIAZIDE 25 MG PO TABS
25.0000 mg | ORAL_TABLET | Freq: Every morning | ORAL | 5 refills | Status: AC
  Filled 2021-09-14 – 2022-01-18 (×2): qty 60, 60d supply, fill #0
  Filled 2022-03-23: qty 60, 60d supply, fill #1
  Filled 2022-06-01: qty 60, 60d supply, fill #2

## 2021-09-15 ENCOUNTER — Other Ambulatory Visit (HOSPITAL_COMMUNITY): Payer: Self-pay

## 2021-10-07 ENCOUNTER — Other Ambulatory Visit (HOSPITAL_COMMUNITY): Payer: Self-pay

## 2021-10-07 MED ORDER — VENLAFAXINE HCL ER 150 MG PO CP24
ORAL_CAPSULE | ORAL | 1 refills | Status: DC
  Filled 2021-10-07: qty 90, 90d supply, fill #0
  Filled 2022-02-02: qty 90, 90d supply, fill #1

## 2021-10-11 ENCOUNTER — Other Ambulatory Visit (HOSPITAL_COMMUNITY): Payer: Self-pay

## 2021-10-12 ENCOUNTER — Other Ambulatory Visit (HOSPITAL_COMMUNITY): Payer: Self-pay

## 2021-11-03 ENCOUNTER — Other Ambulatory Visit (HOSPITAL_COMMUNITY): Payer: Self-pay

## 2021-12-08 ENCOUNTER — Other Ambulatory Visit (HOSPITAL_COMMUNITY): Payer: Self-pay

## 2022-01-03 ENCOUNTER — Other Ambulatory Visit (HOSPITAL_COMMUNITY): Payer: Self-pay

## 2022-01-04 ENCOUNTER — Other Ambulatory Visit (HOSPITAL_COMMUNITY): Payer: Self-pay

## 2022-01-04 MED ORDER — LOSARTAN POTASSIUM 100 MG PO TABS
100.0000 mg | ORAL_TABLET | Freq: Every day | ORAL | 2 refills | Status: DC
  Filled 2022-01-04 – 2022-01-10 (×3): qty 30, 30d supply, fill #0
  Filled 2022-02-06: qty 30, 30d supply, fill #1
  Filled 2022-03-10: qty 30, 30d supply, fill #2

## 2022-01-10 ENCOUNTER — Other Ambulatory Visit (HOSPITAL_COMMUNITY): Payer: Self-pay

## 2022-01-11 ENCOUNTER — Other Ambulatory Visit (HOSPITAL_COMMUNITY): Payer: Self-pay

## 2022-01-18 ENCOUNTER — Other Ambulatory Visit (HOSPITAL_COMMUNITY): Payer: Self-pay

## 2022-02-02 ENCOUNTER — Other Ambulatory Visit (HOSPITAL_COMMUNITY): Payer: Self-pay

## 2022-02-03 ENCOUNTER — Other Ambulatory Visit (HOSPITAL_COMMUNITY): Payer: Self-pay

## 2022-02-06 ENCOUNTER — Other Ambulatory Visit (HOSPITAL_COMMUNITY): Payer: Self-pay

## 2022-02-07 ENCOUNTER — Other Ambulatory Visit (HOSPITAL_COMMUNITY): Payer: Self-pay

## 2022-03-10 ENCOUNTER — Other Ambulatory Visit (HOSPITAL_COMMUNITY): Payer: Self-pay

## 2022-03-23 ENCOUNTER — Other Ambulatory Visit (HOSPITAL_COMMUNITY): Payer: Self-pay

## 2022-04-11 ENCOUNTER — Other Ambulatory Visit (HOSPITAL_COMMUNITY): Payer: Self-pay

## 2022-04-12 ENCOUNTER — Other Ambulatory Visit (HOSPITAL_COMMUNITY): Payer: Self-pay

## 2022-04-12 MED ORDER — LOSARTAN POTASSIUM 100 MG PO TABS
100.0000 mg | ORAL_TABLET | Freq: Every day | ORAL | 0 refills | Status: AC
  Filled 2022-04-12: qty 30, 30d supply, fill #0

## 2022-04-29 ENCOUNTER — Other Ambulatory Visit: Payer: Self-pay

## 2022-04-29 ENCOUNTER — Other Ambulatory Visit (HOSPITAL_COMMUNITY): Payer: Self-pay

## 2022-04-29 MED ORDER — VENLAFAXINE HCL ER 150 MG PO CP24
150.0000 mg | ORAL_CAPSULE | Freq: Every day | ORAL | 0 refills | Status: AC
  Filled 2022-04-29: qty 30, 30d supply, fill #0

## 2022-05-13 ENCOUNTER — Other Ambulatory Visit (HOSPITAL_COMMUNITY): Payer: Self-pay

## 2022-05-16 ENCOUNTER — Other Ambulatory Visit (HOSPITAL_COMMUNITY): Payer: Self-pay

## 2022-05-26 ENCOUNTER — Other Ambulatory Visit (HOSPITAL_COMMUNITY): Payer: Self-pay

## 2022-06-01 ENCOUNTER — Other Ambulatory Visit (HOSPITAL_COMMUNITY): Payer: Self-pay

## 2022-09-14 ENCOUNTER — Other Ambulatory Visit (HOSPITAL_COMMUNITY): Payer: Self-pay

## 2023-04-24 ENCOUNTER — Other Ambulatory Visit (HOSPITAL_COMMUNITY): Payer: Self-pay
